# Patient Record
Sex: Male | Born: 1980 | Race: Black or African American | Hispanic: No | Marital: Single | Smoking: Former smoker
Health system: Southern US, Community
[De-identification: ages and names within clinical notes are randomized; demographics above are authoritative.]

## PROBLEM LIST (undated history)

## (undated) DIAGNOSIS — F410 Panic disorder [episodic paroxysmal anxiety] without agoraphobia: Secondary | ICD-10-CM

## (undated) DIAGNOSIS — R002 Palpitations: Secondary | ICD-10-CM

## (undated) DIAGNOSIS — F952 Tourette's disorder: Secondary | ICD-10-CM

## (undated) DIAGNOSIS — E785 Hyperlipidemia, unspecified: Secondary | ICD-10-CM

## (undated) DIAGNOSIS — E878 Other disorders of electrolyte and fluid balance, not elsewhere classified: Secondary | ICD-10-CM

## (undated) DIAGNOSIS — F4310 Post-traumatic stress disorder, unspecified: Secondary | ICD-10-CM

## (undated) DIAGNOSIS — K603 Anal fistula, unspecified: Secondary | ICD-10-CM

## (undated) DIAGNOSIS — H93A9 Pulsatile tinnitus, unspecified ear: Secondary | ICD-10-CM

## (undated) DIAGNOSIS — E669 Obesity, unspecified: Secondary | ICD-10-CM

## (undated) DIAGNOSIS — F419 Anxiety disorder, unspecified: Secondary | ICD-10-CM

## (undated) DIAGNOSIS — G4730 Sleep apnea, unspecified: Secondary | ICD-10-CM

## (undated) DIAGNOSIS — F319 Bipolar disorder, unspecified: Secondary | ICD-10-CM

## (undated) DIAGNOSIS — F329 Major depressive disorder, single episode, unspecified: Secondary | ICD-10-CM

## (undated) DIAGNOSIS — F32A Depression, unspecified: Secondary | ICD-10-CM

## (undated) DIAGNOSIS — F959 Tic disorder, unspecified: Secondary | ICD-10-CM

## (undated) DIAGNOSIS — R42 Dizziness and giddiness: Secondary | ICD-10-CM

## (undated) DIAGNOSIS — I10 Essential (primary) hypertension: Secondary | ICD-10-CM

---

## 2009-07-22 ENCOUNTER — Emergency Department (HOSPITAL_COMMUNITY): Admission: EM | Admit: 2009-07-22 | Discharge: 2009-07-22 | Payer: Self-pay | Admitting: Emergency Medicine

## 2010-11-26 ENCOUNTER — Emergency Department (HOSPITAL_COMMUNITY)
Admission: EM | Admit: 2010-11-26 | Discharge: 2010-11-26 | Attending: Emergency Medicine | Admitting: Emergency Medicine

## 2010-11-26 ENCOUNTER — Emergency Department (HOSPITAL_COMMUNITY)

## 2010-11-26 DIAGNOSIS — S59909A Unspecified injury of unspecified elbow, initial encounter: Secondary | ICD-10-CM | POA: Insufficient documentation

## 2010-11-26 DIAGNOSIS — IMO0001 Reserved for inherently not codable concepts without codable children: Secondary | ICD-10-CM | POA: Insufficient documentation

## 2010-11-26 DIAGNOSIS — M545 Low back pain, unspecified: Secondary | ICD-10-CM | POA: Insufficient documentation

## 2010-11-26 DIAGNOSIS — S6990XA Unspecified injury of unspecified wrist, hand and finger(s), initial encounter: Secondary | ICD-10-CM | POA: Insufficient documentation

## 2010-11-26 DIAGNOSIS — IMO0002 Reserved for concepts with insufficient information to code with codable children: Secondary | ICD-10-CM | POA: Insufficient documentation

## 2010-11-26 DIAGNOSIS — M79609 Pain in unspecified limb: Secondary | ICD-10-CM | POA: Insufficient documentation

## 2010-11-26 DIAGNOSIS — S59919A Unspecified injury of unspecified forearm, initial encounter: Secondary | ICD-10-CM | POA: Insufficient documentation

## 2010-11-26 LAB — CBC
MCH: 31 pg (ref 26.0–34.0)
MCHC: 35.1 g/dL (ref 30.0–36.0)
Platelets: 292 10*3/uL (ref 150–400)
RBC: 5.42 MIL/uL (ref 4.22–5.81)

## 2010-11-26 LAB — URINALYSIS, ROUTINE W REFLEX MICROSCOPIC
Bilirubin Urine: NEGATIVE
Glucose, UA: NEGATIVE mg/dL
Hgb urine dipstick: NEGATIVE
Ketones, ur: NEGATIVE mg/dL
Protein, ur: NEGATIVE mg/dL
pH: 5.5 (ref 5.0–8.0)

## 2010-11-26 LAB — POCT I-STAT, CHEM 8
BUN: 15 mg/dL (ref 6–23)
Calcium, Ion: 1.13 mmol/L (ref 1.12–1.32)
Chloride: 105 mEq/L (ref 96–112)
Creatinine, Ser: 1.3 mg/dL (ref 0.4–1.5)
Glucose, Bld: 126 mg/dL — ABNORMAL HIGH (ref 70–99)
TCO2: 22 mmol/L (ref 0–100)

## 2010-11-26 LAB — RAPID URINE DRUG SCREEN, HOSP PERFORMED
Amphetamines: NOT DETECTED
Barbiturates: NOT DETECTED
Benzodiazepines: NOT DETECTED
Tetrahydrocannabinol: POSITIVE — AB

## 2010-11-26 LAB — DIFFERENTIAL
Basophils Absolute: 0 10*3/uL (ref 0.0–0.1)
Eosinophils Absolute: 0 10*3/uL (ref 0.0–0.7)
Lymphocytes Relative: 13 % (ref 12–46)
Neutro Abs: 12.7 10*3/uL — ABNORMAL HIGH (ref 1.7–7.7)

## 2010-11-26 LAB — ETHANOL: Alcohol, Ethyl (B): <5 mg/dL (ref 0–10)

## 2010-11-27 LAB — GC/CHLAMYDIA PROBE AMP, GENITAL: GC Probe Amp, Genital: NEGATIVE

## 2016-03-03 ENCOUNTER — Emergency Department (HOSPITAL_COMMUNITY)
Admission: EM | Admit: 2016-03-03 | Discharge: 2016-03-03 | Disposition: A | Discharge status: Home / Self Care | Payer: Self-pay | Attending: Emergency Medicine | Admitting: Emergency Medicine

## 2016-03-03 ENCOUNTER — Encounter (HOSPITAL_COMMUNITY): Payer: Self-pay | Admitting: *Deleted

## 2016-03-03 DIAGNOSIS — K4090 Unilateral inguinal hernia, without obstruction or gangrene, not specified as recurrent: Secondary | ICD-10-CM | POA: Insufficient documentation

## 2016-03-03 DIAGNOSIS — I10 Essential (primary) hypertension: Secondary | ICD-10-CM | POA: Insufficient documentation

## 2016-03-03 HISTORY — DX: Other disorders of electrolyte and fluid balance, not elsewhere classified: E87.8

## 2016-03-03 HISTORY — DX: Depression, unspecified: F32.A

## 2016-03-03 HISTORY — DX: Major depressive disorder, single episode, unspecified: F32.9

## 2016-03-03 HISTORY — DX: Essential (primary) hypertension: I10

## 2016-03-03 LAB — URINALYSIS, ROUTINE W REFLEX MICROSCOPIC
Bilirubin Urine: NEGATIVE
Glucose, UA: NEGATIVE mg/dL
HGB URINE DIPSTICK: NEGATIVE
Ketones, ur: NEGATIVE mg/dL
LEUKOCYTES UA: NEGATIVE
NITRITE: NEGATIVE
PROTEIN: NEGATIVE mg/dL
Specific Gravity, Urine: 1.027 (ref 1.005–1.030)
pH: 5.5 (ref 5.0–8.0)

## 2016-03-03 NOTE — ED Notes (Signed)
Pt c/o left groin pain for three weeks, possible hernia. Pt states certain types of movement increases pain. Pt states he can feel a lump in her left groin. Pt denies dysuria, hematuria.

## 2016-03-03 NOTE — ED Provider Notes (Signed)
  History  By signing my name below, I, Samuel Austin, attest that this documentation has been prepared under the direction and in the presence of Arthor CaptainAbigail Robbie Nangle, PA-C. Electronically Signed: Earmon PhoenixJennifer Austin, ED Scribe. 03/03/2016. 11:16 PM.  Chief Complaint  Patient presents with  . Groin Pain   The history is provided by the patient and medical records. No language interpreter was used.    HPI Comments:  Samuel Austin is an obese 35 y.o. male who presents to the Emergency Department complaining of an unchanged, small lump to the left groin that began about three weeks ago. He states he believes he may have a hernia. He reports mild to moderate pain depending on what he is doing. He has not done anything to treat the pain. Moving, coughing or stretching increases the pain. Resting alleviates the pain. He denies fever, chills, nausea, vomiting, penile or scrotal pain or swelling.  Past Medical History  Diagnosis Date  . Hyperchloremia   . Hypertension   . Depression    No past surgical history on file. History reviewed. No pertinent family history. Social History  Substance Use Topics  . Smoking status: Never Smoker   . Smokeless tobacco: Never Used  . Alcohol Use: Yes    Review of Systems  Constitutional: Negative for fever and chills.  Gastrointestinal: Negative for nausea and vomiting.  Genitourinary:       Lump to left groin    Allergies  Motrin  Home Medications   Prior to Admission medications   Not on File   Triage Vitals: BP 123/82 mmHg  Pulse 64  Temp(Src) 98.7 F (37.1 C) (Oral)  Resp 16  Ht 5\' 4"  (1.626 m)  Wt 220 lb 4 oz (99.905 kg)  BMI 37.79 kg/m2  SpO2 98% Physical Exam  Constitutional: He is oriented to person, place, and time. He appears well-developed and well-nourished.  HENT:  Head: Normocephalic and atraumatic.  Eyes: EOM are normal.  Neck: Normal range of motion.  Cardiovascular: Normal rate.   Pulmonary/Chest: Effort normal.   Abdominal: A hernia is present. Hernia confirmed positive in the left inguinal area.  Musculoskeletal: Normal range of motion.  Neurological: He is alert and oriented to person, place, and time.  Skin: Skin is warm and dry.  Psychiatric: He has a normal mood and affect. His behavior is normal.  Nursing note and vitals reviewed.   ED Course  Procedures (including critical care time) DIAGNOSTIC STUDIES: Oxygen Saturation is 98% on RA, normal by my interpretation.   COORDINATION OF CARE: 11:02 PM- Will give referral to surgery center for follow up. Pt verbalizes understanding and agrees to plan.  Medications - No data to display  Labs Review Labs Reviewed  URINALYSIS, ROUTINE W REFLEX MICROSCOPIC (NOT AT [facility])    MDM   Final diagnoses:  Direct inguinal hernia    Patient with easily reducible direct inguinal hernia. No signs of incarceration. Nontender. More palpable with valsalva or cough. Discussed supportive care, elective surgery, reasons to seek immediate medical care.  Appears safe for discharge at this time.    I personally performed the services described in this documentation, which was scribed in my presence. The recorded information has been reviewed and is accurate.       Arthor Captainbigail Hendry Speas, PA-C 03/04/16 0129  Benjiman CoreNathan Pickering, MD 03/05/16 [phone]

## 2016-03-03 NOTE — Discharge Instructions (Signed)

## 2016-07-29 ENCOUNTER — Encounter (HOSPITAL_COMMUNITY): Payer: Self-pay | Admitting: Emergency Medicine

## 2016-07-29 ENCOUNTER — Emergency Department (HOSPITAL_COMMUNITY)
Admission: EM | Admit: 2016-07-29 | Discharge: 2016-07-29 | Disposition: A | Discharge status: Home / Self Care | Payer: Self-pay | Attending: Emergency Medicine | Admitting: Emergency Medicine

## 2016-07-29 DIAGNOSIS — R197 Diarrhea, unspecified: Secondary | ICD-10-CM | POA: Insufficient documentation

## 2016-07-29 DIAGNOSIS — I10 Essential (primary) hypertension: Secondary | ICD-10-CM | POA: Insufficient documentation

## 2016-07-29 DIAGNOSIS — Z7982 Long term (current) use of aspirin: Secondary | ICD-10-CM | POA: Insufficient documentation

## 2016-07-29 LAB — URINALYSIS, ROUTINE W REFLEX MICROSCOPIC
Bacteria, UA: NONE SEEN
Bilirubin Urine: NEGATIVE
Glucose, UA: NEGATIVE mg/dL
Hgb urine dipstick: NEGATIVE
KETONES UR: NEGATIVE mg/dL
Leukocytes, UA: NEGATIVE
Nitrite: NEGATIVE
PROTEIN: 100 mg/dL — AB
Specific Gravity, Urine: 1.027 (ref 1.005–1.030)
pH: 8 (ref 5.0–8.0)

## 2016-07-29 LAB — COMPREHENSIVE METABOLIC PANEL
ALBUMIN: 4.3 g/dL (ref 3.5–5.0)
ALT: 30 U/L (ref 17–63)
AST: 56 U/L — AB (ref 15–41)
Alkaline Phosphatase: 61 U/L (ref 38–126)
Anion gap: 7 (ref 5–15)
BUN: 13 mg/dL (ref 6–20)
CHLORIDE: 107 mmol/L (ref 101–111)
CO2: 26 mmol/L (ref 22–32)
Calcium: 9.3 mg/dL (ref 8.9–10.3)
Creatinine, Ser: 1.2 mg/dL (ref 0.61–1.24)
GFR calc Af Amer: >60 mL/min (ref >60)
GFR calc non Af Amer: >60 mL/min (ref >60)
GLUCOSE: 102 mg/dL — AB (ref 65–99)
POTASSIUM: 4.8 mmol/L (ref 3.5–5.1)
Sodium: 140 mmol/L (ref 135–145)
Total Bilirubin: 1.6 mg/dL — ABNORMAL HIGH (ref 0.3–1.2)
Total Protein: 7.2 g/dL (ref 6.5–8.1)

## 2016-07-29 LAB — CBC
HCT: 50.5 % (ref 39.0–52.0)
HEMOGLOBIN: 17.7 g/dL — AB (ref 13.0–17.0)
MCH: 31.3 pg (ref 26.0–34.0)
MCHC: 35 g/dL (ref 30.0–36.0)
MCV: 89.2 fL (ref 78.0–100.0)
Platelets: 238 10*3/uL (ref 150–400)
RBC: 5.66 MIL/uL (ref 4.22–5.81)
RDW: 12.4 % (ref 11.5–15.5)
WBC: 10.5 10*3/uL (ref 4.0–10.5)

## 2016-07-29 LAB — LIPASE, BLOOD: LIPASE: 96 U/L — AB (ref 11–51)

## 2016-07-29 MED ORDER — ONDANSETRON 4 MG PO TBDP
ORAL_TABLET | ORAL | Status: AC
  Filled 2016-07-29: qty 1

## 2016-07-29 MED ORDER — ONDANSETRON 4 MG PO TBDP
4.0000 mg | ORAL_TABLET | Freq: Once | ORAL | Status: AC | PRN
  Administered 2016-07-29: 4 mg via ORAL

## 2016-07-29 MED ORDER — ONDANSETRON 4 MG PO TBDP
4.0000 mg | ORAL_TABLET | Freq: Once | ORAL | Status: DC
  Filled 2016-07-29: qty 1

## 2016-07-29 MED ORDER — DIPHENOXYLATE-ATROPINE 2.5-0.025 MG PO TABS
2.0000 | ORAL_TABLET | Freq: Once | ORAL | Status: AC
  Administered 2016-07-29: 2 via ORAL
  Filled 2016-07-29: qty 2

## 2016-07-29 NOTE — ED Notes (Signed)
[place], [place] aware pt is requesting to see her.

## 2016-07-29 NOTE — ED Triage Notes (Signed)
Pt sts nausea and diarrhea starting this am with body aches

## 2016-07-29 NOTE — ED Notes (Signed)
TAKING PO FLUIDS AND TOLERATING WELL.

## 2016-07-29 NOTE — ED Provider Notes (Signed)
[facility]-EMERGENCY DEPT Provider Note   CSN: [id number] Arrival date & time: 07/29/16  1333     History   Chief Complaint Chief Complaint  Patient presents with  . Nausea  . Diarrhea  . Generalized Body Aches    HPI Samuel Austin is a 35 y.o. male.  The history is provided by the patient. No language interpreter was used.  Diarrhea   This is a new problem. The problem occurs 2 to 4 times per day. The problem has been gradually worsening. There has been no fever. Pertinent negatives include no abdominal pain. He has tried nothing for the symptoms. The treatment provided no relief. Risk factors include suspect food intake. His past medical history does not include recent abdominal surgery.  Pt reports indigestion and multiple episodes of  diarrhea.  Pt   Past Medical History:  Diagnosis Date  . Depression   . Hyperchloremia   . Hypertension     There are no active problems to display for this patient.   History reviewed. No pertinent surgical history.     Home Medications    Prior to Admission medications   Medication Sig Start Date End Date Taking? Authorizing Provider  aspirin EC 81 MG tablet Take 81 mg by mouth daily.   Yes Historical Provider, MD  hydrochlorothiazide (HYDRODIURIL) 25 MG tablet Take 25 mg by mouth daily.   Yes Historical Provider, MD  lisinopril (PRINIVIL,ZESTRIL) 20 MG tablet Take 20 mg by mouth daily.   Yes Historical Provider, MD    Family History History reviewed. No pertinent family history.  Social History Social History  Substance Use Topics  . Smoking status: Never Smoker  . Smokeless tobacco: Never Used  . Alcohol use Yes     Allergies   Motrin [ibuprofen]   Review of Systems Review of Systems  Gastrointestinal: Positive for diarrhea and nausea. Negative for abdominal pain.  All other systems reviewed and are negative.    Physical Exam Updated Vital Signs BP 148/94   Pulse 79   Temp 99.4 F (37.4 C) (Oral)    Resp 18   SpO2 99%   Physical Exam  Constitutional: He appears well-developed and well-nourished.  HENT:  Head: Normocephalic and atraumatic.  Eyes: Conjunctivae are normal.  Neck: Neck supple.  Cardiovascular: Normal rate, regular rhythm and normal heart sounds.   No murmur heard. Pulmonary/Chest: Effort normal and breath sounds normal. No respiratory distress.  Abdominal: Soft. He exhibits no distension. There is no tenderness. There is no guarding.  Musculoskeletal: He exhibits no edema.  Neurological: He is alert.  Skin: Skin is warm and dry.  Psychiatric: He has a normal mood and affect.  Nursing note and vitals reviewed.    ED Treatments / Results  Labs (all labs ordered are listed, but only abnormal results are displayed) Labs Reviewed  CBC - Abnormal; Notable for the following:       Result Value   Hemoglobin 17.7 (*)    All other components within normal limits  URINALYSIS, ROUTINE W REFLEX MICROSCOPIC - Abnormal; Notable for the following:    Protein, ur 100 (*)    Squamous Epithelial / LPF 0-5 (*)    All other components within normal limits  COMPREHENSIVE METABOLIC PANEL - Abnormal; Notable for the following:    Glucose, Bld 102 (*)    AST 56 (*)    Total Bilirubin 1.6 (*)    All other components within normal limits  LIPASE, BLOOD - Abnormal; Notable for the  following:    Lipase 96 (*)    All other components within normal limits    EKG  EKG Interpretation None       Radiology No results found.  Procedures Procedures (including critical care time)  Medications Ordered in ED Medications  ondansetron (ZOFRAN-ODT) disintegrating tablet 4 mg (4 mg Oral Not Given 07/29/16 1645)  ondansetron (ZOFRAN-ODT) disintegrating tablet 4 mg (4 mg Oral Given 07/29/16 1345)  diphenoxylate-atropine (LOMOTIL) 2.5-0.025 MG per tablet 2 tablet (2 tablets Oral Given 07/29/16 1655)     Initial Impression / Assessment and Plan / ED Course  I have reviewed the triage  vital signs and the nursing notes.  Pertinent labs & imaging results that were available during my care of the patient were reviewed by me and considered in my medical decision making (see chart for details).  Clinical Course     Pt tolerating po fluids.  No sign of acute abdomen.  Pt suspects food related illness, more likely viral. I doubt pancreatitis.   Pt counseled on need to return if symptoms worsen.   No diarrhea while here.  24 ounces po fluids.    Final Clinical Impressions(s) / ED Diagnoses   Final diagnoses:  Diarrhea, unspecified type    New Prescriptions Discharge Medication List as of 07/29/2016  7:33 PM    An After Visit Summary was printed and given to the patient.   Lonia SkinnerLeslie K SpearvilleSofia, PA-C 07/29/16 1956    Canary Brimhristopher J Tegeler, MD 07/30/16 [phone]

## 2016-07-29 NOTE — ED Notes (Signed)
Pt stats nausea improved from earlier meds. Will imform MD.

## 2016-07-29 NOTE — Discharge Instructions (Signed)
Return if any problems.

## 2016-07-29 NOTE — ED Notes (Signed)
Lab called CMP and Lipase hemolyzed - redraw

## 2016-07-29 NOTE — ED Notes (Signed)
Pt taking po fluids and requesting update.

## 2018-01-21 ENCOUNTER — Encounter (HOSPITAL_COMMUNITY): Payer: Self-pay

## 2018-01-21 ENCOUNTER — Emergency Department (HOSPITAL_COMMUNITY): Payer: Self-pay

## 2018-01-21 ENCOUNTER — Emergency Department (HOSPITAL_COMMUNITY)
Admission: EM | Admit: 2018-01-21 | Discharge: 2018-01-22 | Disposition: A | Discharge status: Home / Self Care | Payer: Self-pay | Attending: Physician Assistant | Admitting: Physician Assistant

## 2018-01-21 ENCOUNTER — Other Ambulatory Visit: Payer: Self-pay

## 2018-01-21 DIAGNOSIS — R51 Headache: Secondary | ICD-10-CM | POA: Insufficient documentation

## 2018-01-21 DIAGNOSIS — I10 Essential (primary) hypertension: Secondary | ICD-10-CM | POA: Insufficient documentation

## 2018-01-21 DIAGNOSIS — M79602 Pain in left arm: Secondary | ICD-10-CM | POA: Insufficient documentation

## 2018-01-21 DIAGNOSIS — R0602 Shortness of breath: Secondary | ICD-10-CM | POA: Insufficient documentation

## 2018-01-21 DIAGNOSIS — F1210 Cannabis abuse, uncomplicated: Secondary | ICD-10-CM | POA: Insufficient documentation

## 2018-01-21 DIAGNOSIS — Z7982 Long term (current) use of aspirin: Secondary | ICD-10-CM | POA: Insufficient documentation

## 2018-01-21 DIAGNOSIS — R42 Dizziness and giddiness: Secondary | ICD-10-CM | POA: Insufficient documentation

## 2018-01-21 DIAGNOSIS — Z79899 Other long term (current) drug therapy: Secondary | ICD-10-CM | POA: Insufficient documentation

## 2018-01-21 DIAGNOSIS — R202 Paresthesia of skin: Secondary | ICD-10-CM | POA: Insufficient documentation

## 2018-01-21 LAB — URINALYSIS, ROUTINE W REFLEX MICROSCOPIC
BACTERIA UA: NONE SEEN
Bilirubin Urine: NEGATIVE
GLUCOSE, UA: NEGATIVE mg/dL
Ketones, ur: NEGATIVE mg/dL
Leukocytes, UA: NEGATIVE
Nitrite: NEGATIVE
Protein, ur: NEGATIVE mg/dL
Specific Gravity, Urine: 1.02 (ref 1.005–1.030)
pH: 5 (ref 5.0–8.0)

## 2018-01-21 LAB — BASIC METABOLIC PANEL
Anion gap: 14 (ref 5–15)
BUN: 15 mg/dL (ref 6–20)
CHLORIDE: 104 mmol/L (ref 101–111)
CO2: 23 mmol/L (ref 22–32)
CREATININE: 1.19 mg/dL (ref 0.61–1.24)
Calcium: 9.8 mg/dL (ref 8.9–10.3)
GFR calc Af Amer: >60 mL/min (ref >60)
GFR calc non Af Amer: >60 mL/min (ref >60)
Glucose, Bld: 96 mg/dL (ref 65–99)
POTASSIUM: 4.4 mmol/L (ref 3.5–5.1)
Sodium: 141 mmol/L (ref 135–145)

## 2018-01-21 LAB — CBC
HEMATOCRIT: 52.2 % — AB (ref 39.0–52.0)
Hemoglobin: 17.3 g/dL — ABNORMAL HIGH (ref 13.0–17.0)
MCH: 29.2 pg (ref 26.0–34.0)
MCHC: 33.1 g/dL (ref 30.0–36.0)
MCV: 88.2 fL (ref 78.0–100.0)
PLATELETS: 337 10*3/uL (ref 150–400)
RBC: 5.92 MIL/uL — AB (ref 4.22–5.81)
RDW: 12.2 % (ref 11.5–15.5)
WBC: 6.7 10*3/uL (ref 4.0–10.5)

## 2018-01-21 LAB — I-STAT TROPONIN, ED: TROPONIN I, POC: 0 ng/mL (ref 0.00–0.08)

## 2018-01-21 LAB — CBG MONITORING, ED: Glucose-Capillary: 93 mg/dL (ref 65–99)

## 2018-01-21 MED ORDER — SODIUM CHLORIDE 0.9 % IV BOLUS
1000.0000 mL | Freq: Once | INTRAVENOUS | Status: AC
  Administered 2018-01-21: 1000 mL via INTRAVENOUS

## 2018-01-21 MED ORDER — SODIUM CHLORIDE 0.9 % IV BOLUS: 1000.0000 mL | Freq: Once | INTRAVENOUS | Status: DC

## 2018-01-21 NOTE — ED Triage Notes (Signed)
Pt states he has been having tingling in his left arm X2 days. He reports this morning he began having some dizziness, shortness of breath, and headache this morning. Pt takes HTN medications. Pt alert and oriented at this time.

## 2018-01-21 NOTE — ED Notes (Signed)
Pt given urine sample at this time.  

## 2018-01-21 NOTE — ED Notes (Signed)
ED Provider at bedside. 

## 2018-01-21 NOTE — ED Provider Notes (Signed)
Patient placed in Quick Look pathway, seen and evaluated   Chief Complaint: Arm tingling, shortness of breath, headache  HPI:   The past several days, patient has had left arm tingling.  It is intermittent, described as a heat and tingling which resolved without intervention.  This morning, he had an episode of shortness of breath.  This has resolved.  No chest pain.  He has a mild headache.  He called his aunt who told him it could be lots of scary things and he should come to the hospital.  He has a history of hypertension for which he takes medication (took it this am).  No history of diabetes.  No weakness, is not dropping things with his arm.  No fall or injury.  He did smoke marijuana this morning.  He states there might have been feeling anxious this morning when he was feeling short of breath.  ROS: Headache, right arm tingling, SOB  Physical Exam:   Gen: No distress  Neuro: Awake and Alert.  Grip strength intact bilaterally.  Upper arm strength equal bilaterally.  Radial pulses intact bilaterally.  Sensation intact bilaterally.  PERRLA and EOMI.  No obvious neurologic deficits.             CV/Pulm. RRR. CTAB  Skin: Warm  Will order basic labs for further evaluation.   Initiation of care has begun. The patient has been counseled on the process, plan, and necessity for staying for the completion/evaluation, and the remainder of the medical screening examination    Alveria Apley, PA-C 01/21/18 1340    Arby Barrette, MD 01/28/18 1357

## 2018-01-21 NOTE — ED Notes (Signed)
Patient transported to CT 

## 2018-01-21 NOTE — ED Notes (Signed)
Patient transported to X-ray 

## 2018-01-21 NOTE — Discharge Instructions (Signed)
Your work-up has been reassuring.  It is very important that you take your blood pressure medicine as prescribed daily.  Recommend diet and exercise.  Drink plenty of fluids stay hydrated.  Make she follow-up with a primary care doctor to have a thorough work-up.  Return to ED if you develop worsening chest pain, shortness of breath, headaches, vision changes or numbness and tingling in your arms and legs.

## 2018-01-22 LAB — I-STAT TROPONIN, ED: Troponin i, poc: 0 ng/mL (ref 0.00–0.08)

## 2018-01-22 NOTE — ED Provider Notes (Signed)
[facility] EMERGENCY DEPARTMENT Provider Note   CSN: [id number] Arrival date & time: 01/21/18  1313     History   Chief Complaint Chief Complaint  Patient presents with  . Shortness of Breath  . Tingling    HPI Samuel Austin is a 37 y.o. male.  HPI 37 year old African-American male past medical history significant for hypertension presents to the emergency department today for evaluation of shortness of breath, left arm "tingling" and tightness, and intermittent headaches and dizziness.  Patient states that the symptoms have been ongoing for the past several months.  States for the past 2 weeks they have become more frequent.  States that he woke up this morning feeling okay.  Started to eat applesauce and started to get "hot feeling in his head".  Patient states that he felt like his left arm was tingling and he had to shake it out.  He states that his left arm started to get tight.  He became short of breath and was sweating.  States he had to stand from the [facility] unit.  Patient denies any chest pain with these episodes.  He states his episodes have no aggravating or relieving factors.  They usually resolve after he takes his blood pressure medicine.  Patient reports history of hypertension but states that he does not take his blood pressure medicine daily.  He takes it only when he thinks he needs it.  Patient reports marijuana history but no other drug use.  Patient states that during the episode this morning he was feeling very anxious and possibly having a panic attack.  Patient states that all his symptoms have completely resolved at this time.  Patient states that he will have a mild headache with his episodes but denies any vision changes.  Nothing makes symptoms worse.  He has taken no other medications for his symptoms prior to arrival.  She denies any cardiac history.  Denies any significant family cardiac history.  Patient denies any history of diabetes.   Denies any tobacco use except for marijuana.  Denies any history of PE/DVT, prolonged immobilization, recent hospitalization/surgeries, unilateral leg swelling or calf tenderness, hemoptysis.  Pt denies any fever, chill, ha, vision changes, lightheadedness,  congestion, neck pain, cough, abd pain, n/v/d, urinary symptoms, change in bowel habits, melena, hematochezia, lower extremity paresthesias.  Past Medical History:  Diagnosis Date  . Depression   . Hyperchloremia   . Hypertension     There are no active problems to display for this patient.   History reviewed. No pertinent surgical history.      Home Medications    Prior to Admission medications   Medication Sig Start Date End Date Taking? Authorizing Provider  aspirin EC 81 MG tablet Take 81 mg by mouth daily.   Yes [provider]  hydrochlorothiazide (HYDRODIURIL) 25 MG tablet Take 25 mg by mouth daily.   Yes [provider]  lisinopril (PRINIVIL,ZESTRIL) 20 MG tablet Take 20 mg by mouth daily.   Yes [provider]    Family History History reviewed. No pertinent family history.  Social History Social History   Tobacco Use  . Smoking status: Never Smoker  . Smokeless tobacco: Never Used  Substance Use Topics  . Alcohol use: Yes  . Drug use: Yes    Types: Marijuana     Allergies   Motrin [ibuprofen]   Review of Systems Review of Systems  All other systems reviewed and are negative.    Physical Exam  Updated Vital Signs BP (!) 157/88   Pulse 64   Temp 98.2 F (36.8 C) (Oral)   Resp 19   SpO2 100%   Physical Exam  Constitutional: He is oriented to person, place, and time. He appears well-developed and well-nourished.  Non-toxic appearance. No distress.  HENT:  Head: Normocephalic and atraumatic.  Nose: Nose normal.  Mouth/Throat: Oropharynx is clear and moist.  Eyes: Pupils are equal, round, and reactive to light. Conjunctivae are normal. Right eye exhibits no  discharge. Left eye exhibits no discharge.  Neck: Normal range of motion. Neck supple. No JVD present. No tracheal deviation present.  Cardiovascular: Normal rate, regular rhythm, normal heart sounds and intact distal pulses.  Pulmonary/Chest: Effort normal and breath sounds normal. No respiratory distress. He exhibits no tenderness.  No hypoxia or tachypnea.  Abdominal: Soft. Bowel sounds are normal. He exhibits no distension. There is no tenderness. There is no rebound and no guarding.  Musculoskeletal: Normal range of motion.       Right lower leg: Normal. He exhibits no edema.       Left lower leg: Normal.  No lower extremity edema or calf tenderness.  Lymphadenopathy:    He has no cervical adenopathy.  Neurological: He is alert and oriented to person, place, and time.  The patient is alert, attentive, and oriented x 3. Speech is clear. Cranial nerve II-VII grossly intact. Negative pronator drift. Sensation intact. Strength 5/5 in all extremities. Reflexes 2+ and symmetric at biceps, triceps, knees, and ankles. Rapid alternating movement and fine finger movements intact. Romberg is absent. Posture and gait normal.   Skin: Skin is warm and dry. Capillary refill takes less than 2 seconds. He is not diaphoretic.  Psychiatric: His behavior is normal. Judgment and thought content normal.  Nursing note and vitals reviewed.    ED Treatments / Results  Labs (all labs ordered are listed, but only abnormal results are displayed) Labs Reviewed  CBC - Abnormal; Notable for the following components:      Result Value   RBC 5.92 (*)    Hemoglobin 17.3 (*)    HCT 52.2 (*)    All other components within normal limits  URINALYSIS, ROUTINE W REFLEX MICROSCOPIC - Abnormal; Notable for the following components:   Hgb urine dipstick SMALL (*)    All other components within normal limits  BASIC METABOLIC PANEL  CBG MONITORING, ED  I-STAT TROPONIN, ED  I-STAT TROPONIN, ED    EKG EKG  Interpretation  Date/Time:  Thursday Jan 21 2018 13:19:26 EDT Ventricular Rate:  91 PR Interval:  160 QRS Duration: 100 QT Interval:  366 QTC Calculation: 450 R Axis:   68 Text Interpretation:  Normal sinus rhythm Nonspecific T wave abnormality Abnormal ECG Normal sinus rhythm Confirmed by Corlis LeakMackuen, Courteney ([id number]) on 01/21/2018 8:11:08 PM   Radiology Dg Chest 2 View  Result Date: 01/21/2018 CLINICAL DATA:  Initial evaluation for acute shortness of breath. EXAM: CHEST - 2 VIEW COMPARISON:  None. FINDINGS: Mild cardiomegaly.  Mediastinal silhouette normal. Lungs normally inflated. No focal infiltrates. Mild pulmonary interstitial congestion without frank pulmonary edema. No pleural effusion. No pneumothorax. No acute osseous abnormality. IMPRESSION: Cardiomegaly with mild pulmonary interstitial congestion without frank pulmonary edema. Electronically Signed   By: Rise MuBenjamin  McClintock M.D.   On: 01/21/2018 21:15   Ct Head Wo Contrast  Result Date: 01/21/2018 CLINICAL DATA:  Dizziness/vertigo EXAM: CT HEAD WITHOUT CONTRAST TECHNIQUE: Contiguous axial images were obtained from the base of the skull through the  vertex without intravenous contrast. COMPARISON:  None. FINDINGS: Brain: No evidence of acute infarction, hemorrhage, hydrocephalus, extra-axial collection or mass lesion/mass effect. Vascular: No hyperdense vessel or unexpected calcification. Skull: Normal. Negative for fracture or focal lesion. Sinuses/Orbits: The visualized paranasal sinuses are essentially clear. The mastoid air cells are unopacified. Other: None. IMPRESSION: Normal head CT. Electronically Signed   By: Charline Bills M.D.   On: 01/21/2018 22:06    Procedures Procedures (including critical care time)  Medications Ordered in ED Medications  sodium chloride 0.9 % bolus 1,000 mL (0 mLs Intravenous Stopped 01/21/18 2344)     Initial Impression / Assessment and Plan / ED Course  I have reviewed the triage vital  signs and the nursing notes.  Pertinent labs & imaging results that were available during my care of the patient were reviewed by me and considered in my medical decision making (see chart for details).     Patient presents to the ED for evaluation of intermittent lightheadedness, dizziness, headaches, left arm pain and shortness of breath that he relates to his blood pressure being elevated.  Patient does not take his blood pressure medicine as prescribed.  The symptoms have been ongoing for the past 3 to 4 months.  Acutely worse over the past 2 weeks and becoming more frequent.  Had episode this morning.  All symptoms have completely resolved.  Denies any chest pain with these episodes. PERC negative.   She is very well-appearing.  Vital signs reassuring.  Patient is mildly hypertensive but no tachycardia or hypoxia noted.  Regular rate and rhythm.  Lungs clear to auscultation bilaterally.  Neurovascularly intact in all extremities.  No focal neuro deficit on exam.  No focal abdominal tenderness noted.  Orthostatics were negative.  Labs are very reassuring.  Negative delta troponin.  No leukocytosis.  Mild hemoconcentration with a hemoglobin of 17.3.  No significant electrolyte derangement.  UA shows no signs of infection but small amount of hemoglobin.  Normal kidney function.  EKG shows normal sinus rhythm.  Chest x-ray shows no acute findings.  I did perform CTs head at patient's request that showed no acute findings.  Again patient has no focal neuro deficit on exam.  I doubt symptoms are related to CVA or ICH.  Clinical presentation does not seem consistent with ACS, dissection, PE.   I encouraged patient to take his blood pressure medicine as prescribed and performed diet modifications with exercise.  Patient given resources for primary care and outpatient follow-up.  Patient continues to deny any symptoms.  He was given fluids in the ED.  Vital signs remained reassuring.  He is ready for  discharge at this time.  Pt is hemodynamically stable, in NAD, & able to ambulate in the ED. Evaluation does not show pathology that would require ongoing emergent intervention or inpatient treatment. I explained the diagnosis to the patient. Pain has been managed & has no complaints prior to dc. Pt is comfortable with above plan and is stable for discharge at this time. All questions were answered prior to disposition. Strict return precautions for f/u to the ED were discussed. Encouraged follow up with PCP.   Final Clinical Impressions(s) / ED Diagnoses   Final diagnoses:  SOB (shortness of breath)  Dizziness  Left arm pain    ED Discharge Orders    None       Wallace Keller 01/22/18 0117    Abelino Derrick, MD 01/23/18 [phone]

## 2018-02-23 ENCOUNTER — Other Ambulatory Visit: Payer: Self-pay

## 2018-02-23 ENCOUNTER — Encounter (HOSPITAL_COMMUNITY): Payer: Self-pay

## 2018-02-23 ENCOUNTER — Emergency Department (HOSPITAL_COMMUNITY)
Admission: EM | Admit: 2018-02-23 | Discharge: 2018-02-23 | Disposition: A | Discharge status: Home / Self Care | Payer: Self-pay | Attending: Emergency Medicine | Admitting: Emergency Medicine

## 2018-02-23 DIAGNOSIS — R519 Headache, unspecified: Secondary | ICD-10-CM

## 2018-02-23 DIAGNOSIS — I10 Essential (primary) hypertension: Secondary | ICD-10-CM | POA: Insufficient documentation

## 2018-02-23 DIAGNOSIS — Z79899 Other long term (current) drug therapy: Secondary | ICD-10-CM | POA: Insufficient documentation

## 2018-02-23 DIAGNOSIS — R51 Headache: Secondary | ICD-10-CM

## 2018-02-23 DIAGNOSIS — Z7982 Long term (current) use of aspirin: Secondary | ICD-10-CM | POA: Insufficient documentation

## 2018-02-23 DIAGNOSIS — R030 Elevated blood-pressure reading, without diagnosis of hypertension: Secondary | ICD-10-CM

## 2018-02-23 DIAGNOSIS — F329 Major depressive disorder, single episode, unspecified: Secondary | ICD-10-CM | POA: Insufficient documentation

## 2018-02-23 LAB — BASIC METABOLIC PANEL
Anion gap: 9 (ref 5–15)
BUN: 14 mg/dL (ref 6–20)
CALCIUM: 9.3 mg/dL (ref 8.9–10.3)
CO2: 24 mmol/L (ref 22–32)
Chloride: 105 mmol/L (ref 98–111)
Creatinine, Ser: 1.18 mg/dL (ref 0.61–1.24)
GFR calc Af Amer: >60 mL/min (ref >60)
Glucose, Bld: 100 mg/dL — ABNORMAL HIGH (ref 70–99)
POTASSIUM: 4 mmol/L (ref 3.5–5.1)
SODIUM: 138 mmol/L (ref 135–145)

## 2018-02-23 LAB — CBC
HCT: 49.5 % (ref 39.0–52.0)
HEMOGLOBIN: 16.1 g/dL (ref 13.0–17.0)
MCH: 29.9 pg (ref 26.0–34.0)
MCHC: 32.5 g/dL (ref 30.0–36.0)
MCV: 91.8 fL (ref 78.0–100.0)
Platelets: 310 10*3/uL (ref 150–400)
RBC: 5.39 MIL/uL (ref 4.22–5.81)
RDW: 12.2 % (ref 11.5–15.5)
WBC: 7 10*3/uL (ref 4.0–10.5)

## 2018-02-23 LAB — URINALYSIS, ROUTINE W REFLEX MICROSCOPIC
Bilirubin Urine: NEGATIVE
Glucose, UA: NEGATIVE mg/dL
Hgb urine dipstick: NEGATIVE
KETONES UR: NEGATIVE mg/dL
LEUKOCYTES UA: NEGATIVE
NITRITE: NEGATIVE
Protein, ur: NEGATIVE mg/dL
Specific Gravity, Urine: 1.014 (ref 1.005–1.030)
pH: 5 (ref 5.0–8.0)

## 2018-02-23 LAB — CBG MONITORING, ED: GLUCOSE-CAPILLARY: 101 mg/dL — AB (ref 70–99)

## 2018-02-23 NOTE — ED Provider Notes (Signed)
[facility] EMERGENCY DEPARTMENT Provider [place]   CSN: [id number] Arrival date & time: 02/23/18  8295     History   Chief Complaint Chief Complaint  Patient presents with  . Headache    HPI Samuel Parlato Gruenberg is a 37 y.o. male who presents the emergency department with chief complaint of headache and high blood pressure.  Patient states that he woke this morning with a throbbing headache and felt like he was having hot flashes.  Patient states that he took his blood pressure pressure medication and ate some food in his head felt better.  Later his headache returned so he went to the [facility] and measure his blood pressure and saw that it was 180/110.  He states that this was very concerning to him so he went home and took his afternoon dose of lisinopril.  His headache totally resolved after that.  He did not take anything for pain.  He denies changes in vision, unilateral weakness, difficulty with speech or swallowing, neck stiffness, thunderclap onset, fevers or chills.  HPI  Past Medical History:  Diagnosis Date  . Depression   . Hyperchloremia   . Hypertension     There are no active problems to display for this patient.   History reviewed. No pertinent surgical history.      Home Medications    Prior to Admission medications   Medication Sig Start Date End Date Taking? Authorizing Provider  aspirin EC 81 MG tablet Take 81 mg by mouth daily.    [provider]  hydrochlorothiazide (HYDRODIURIL) 25 MG tablet Take 25 mg by mouth daily.    [provider]  lisinopril (PRINIVIL,ZESTRIL) 20 MG tablet Take 20 mg by mouth daily.    [provider]    Family History History reviewed. No pertinent family history.  Social History Social History   Tobacco Use  . Smoking status: Never Smoker  . Smokeless tobacco: Never Used  Substance Use Topics  . Alcohol use: Yes  . Drug use: Yes    Types: Marijuana     Allergies     Motrin [ibuprofen]   Review of Systems Review of Systems Ten systems reviewed and are negative for acute change, except as noted in the HPI.   Physical Exam Updated Vital Signs BP (!) 146/87 (BP Location: Right Arm)   Pulse 76   Temp 98.8 F (37.1 C) (Oral)   Resp 18   Ht 5\' 4"  (1.626 m)   Wt 112 kg (247 lb)   SpO2 100%   BMI 42.40 kg/m   Physical Exam  Constitutional: He appears well-developed and well-nourished. No distress.  HENT:  Head: Normocephalic and atraumatic.  Eyes: Pupils are equal, round, and reactive to light. Conjunctivae and EOM are normal. No scleral icterus.  Neck: Normal range of motion. Neck supple.  Cardiovascular: Normal rate, regular rhythm and normal heart sounds.  Pulmonary/Chest: Effort normal and breath sounds normal. No respiratory distress.  Abdominal: Soft. There is no tenderness.  Musculoskeletal: He exhibits no edema.  Neurological: He is alert. He displays normal reflexes.  Speech is clear and goal oriented, follows commands Major Cranial nerves without deficit, no facial droop Normal strength in upper and lower extremities bilaterally including dorsiflexion and plantar flexion, strong and equal grip strength Sensation normal to light and sharp touch Moves extremities without ataxia, coordination intact Normal finger to nose and rapid alternating movements Neg romberg, no pronator drift Normal gait Normal heel-shin and balance   Skin: Skin  is warm and dry. He is not diaphoretic.  Psychiatric: His behavior is normal.  Nursing Austin and vitals reviewed.    ED Treatments / Results  Labs (all labs ordered are listed, but only abnormal results are displayed) Labs Reviewed  BASIC METABOLIC PANEL - Abnormal; Notable for the following components:      Result Value   Glucose, Bld 100 (*)    All other components within normal limits  URINALYSIS, ROUTINE W REFLEX MICROSCOPIC - Abnormal; Notable for the following components:   Color, Urine  STRAW (*)    All other components within normal limits  CBG MONITORING, ED - Abnormal; Notable for the following components:   Glucose-Capillary 101 (*)    All other components within normal limits  CBC    EKG EKG Interpretation  Date/Time:  Tuesday February 23 2018 19:19:03 EDT Ventricular Rate:  76 PR Interval:  176 QRS Duration: 96 QT Interval:  372 QTC Calculation: 418 R Axis:   59 Text Interpretation:  Normal sinus rhythm Normal ECG Confirmed by Margarita Grizzleay, Danielle [phone]([id number]) on 02/23/2018 7:48:40 PM   Radiology No results found.  Procedures Procedures (including critical care time)  Medications Ordered in ED Medications - No data to display   Initial Impression / Assessment and Plan / ED Course  I have reviewed the triage vital signs and the nursing notes.  Pertinent labs & imaging results that were available during my care of the patient were reviewed by me and considered in my medical decision making (see chart for details).     Patient with asymptomatic hypertension.  Blood pressure is improved here in the emergency department.  He is headache free. Samuel Austin HA treated and improved while in ED.  Presentation not concerning for [facility], ICH, Meningitis, or temporal arteritis. Samuel Austin is afebrile with no focal neuro deficits, nuchal rigidity, or change in vision. Samuel Austin is to follow up with PCP to discuss prophylactic medication. Samuel Austin verbalizes understanding and is agreeable with plan to dc.    Final Clinical Impressions(s) / ED Diagnoses   Final diagnoses:  Elevated blood pressure reading  Moderate headache    ED Discharge Orders    None       Arthor CaptainHarris, Akil Hoos, PA-C 02/24/18 0031    Margarita Grizzleay, Danielle, MD 02/24/18 [phone]

## 2018-02-23 NOTE — Discharge Instructions (Signed)

## 2018-02-23 NOTE — ED Notes (Signed)
Patient verbalizes understanding of discharge instructions. Opportunity for questioning and answers were provided. Armband removed by staff, pt discharged from ED ambulatory.   

## 2018-02-23 NOTE — ED Provider Notes (Signed)
Patient placed in Quick Look pathway, seen and evaluated   Chief Complaint: headache  HPI:   Donnetta SimpersLance Alexander Velasques is a 37 y.o. male who presents to the ED with headache that started this morning when he got up. At that time he rated the headache as 8/10. Since then is has decreased to 2/10. He took his BP and it was 177/103. Patient reports he also felt dizzy.patient ran out of one of his BP medications 2 days ago. Patient denies change in vision or nose bleed.   ROS: Neuro: headache, dizziness  Physical Exam:  BP (!) 146/87 (BP Location: Right Arm)   Pulse 76   Temp 98.8 F (37.1 C) (Oral)   Resp 18   Ht 5\' 4"  (1.626 m)   Wt 112 kg (247 lb)   SpO2 100%   BMI 42.40 kg/m    Gen: No distress  Neuro: Awake and Alert, ambulatory with steady gait  Skin: Warm and dry     Initiation of care has begun. The patient has been counseled on the process, plan, and necessity for staying for the completion/evaluation, and the remainder of the medical screening examination     Janne Napoleoneese, Adarrius Graeff M, NP 02/23/18 Remigio [id number]    Lockwood, Robert, MD 02/24/18 [phone]

## 2018-02-23 NOTE — ED Triage Notes (Signed)
Pt presents with onset of frontal headache upon awakening this morning.  Pt reports he ran out of one of his HTN meds x 2 days ago; reports dizziness and sweats.  Denies blurred vision or nosebleeds.

## 2018-03-11 ENCOUNTER — Encounter: Payer: Self-pay | Admitting: Family Medicine

## 2018-03-11 ENCOUNTER — Ambulatory Visit: Payer: Self-pay | Admitting: Family Medicine

## 2018-03-11 DIAGNOSIS — E785 Hyperlipidemia, unspecified: Secondary | ICD-10-CM | POA: Insufficient documentation

## 2018-03-11 DIAGNOSIS — I10 Essential (primary) hypertension: Secondary | ICD-10-CM

## 2018-03-11 DIAGNOSIS — F39 Unspecified mood [affective] disorder: Secondary | ICD-10-CM | POA: Insufficient documentation

## 2018-03-11 MED ORDER — LISINOPRIL 20 MG PO TABS: 20.0000 mg | ORAL_TABLET | Freq: Every day | ORAL | 1 refills | Status: DC

## 2018-03-11 MED ORDER — HYDROCHLOROTHIAZIDE 25 MG PO TABS: 25.0000 mg | ORAL_TABLET | Freq: Every day | ORAL | 1 refills | Status: DC

## 2018-03-11 NOTE — Assessment & Plan Note (Signed)
Not well controlled.  Discussed regular medication, sleep and diet.   He reports snoring so may need investigation for sleep apnea if hard to control.  Recent labs and head CT in ER.

## 2018-03-11 NOTE — Assessment & Plan Note (Signed)
By history.  Come in for fasting next visit so can look at triglycerides

## 2018-03-11 NOTE — Patient Instructions (Addendum)
Good to see you today!  Thanks for coming in.  Take both blood pressure medications same time every day  Drink plenty of fluids  Measure your blood pressure daily and bring in the cuff when you come back  Come in next visit without eating for 6 hours  Consider how to stop smoking - substitute for them   Come back 1-2 weeks

## 2018-03-11 NOTE — Assessment & Plan Note (Signed)
Did not explore fully.  Does not appear depressed or anxious today

## 2018-03-11 NOTE — Progress Notes (Signed)
Subjective  Samuel Austin is a 37 y.o. male is presenting with the following  HYPERTENSION Disease Monitoring  Home BP Monitoring (Severity) was 160/70 with his wrist cuff yesterday  Symptoms - Chest pain- no    Dyspnea- n0 Medications (Modifying factors) Compliance-  Taking lisinopril daily to several times a day depending on his blood pressure and how he feels.  Has been out of hctz for weeks. Lightheadedness-  Feel dizzy sometimes when he moves and has had a chronic top of the head ache for months. No focal neuro symptoms  Edema- no Timing - continuous  Duration - 6-7 years ROS - See HPI  DEPRESSION - [facility] and treated with unknown medication which made him sleepy and he does not take now  TRIGLYCERIDES Told in prison he had very high levels and was treated with lovastatin he believes   PMH Lab Review   Potassium  Date Value Ref Range Status  02/23/2018 4.0 3.5 - 5.1 mmol/L Final    Comment:    HEMOLYSIS AT THIS LEVEL MAY AFFECT RESULT   Sodium  Date Value Ref Range Status  02/23/2018 138 135 - 145 mmol/L Final   Creatinine, Ser  Date Value Ref Range Status  02/23/2018 1.18 0.61 - 1.24 mg/dL Final       SH Recently released from prison and his diet and exercise has worsened and he has gained wt  FHX - thinks his mother may have high blood pressure    Chief Complaint noted Review of Symptoms - see HPI PMH - Smoking status noted.    Objective Vital Signs reviewed BP (!) 180/90 (BP Location: Left Arm, Patient Position: Sitting, Cuff Size: Large)   Pulse 88   Resp 14   Ht 5' 2.5" (1.588 m)   Wt 240 lb (108.9 kg)   BMI 43.20 kg/m  Heart - Regular rate and rhythm.  No murmurs, gallops or rubs.    Lungs:  Normal respiratory effort, chest expands symmetrically. Lungs are clear to auscultation, no crackles or wheezes. Abdomen: soft and non-tender without masses, organomegaly or hernias noted.  No guarding or rebound Extremities:   No cyanosis, edema, or deformity noted with good range of motion of all major joints.   Neck:  No deformities, thyromegaly, masses, or tenderness noted.   Supple with full range of motion without pain.  Assessments/Plans  See after visit summary for details of patient instuctions  Hypertension Not well controlled.  Discussed regular medication, sleep and diet.   He reports snoring so may need investigation for sleep apnea if hard to control.  Recent labs and head CT in ER.    Hyperlipidemia By history.  Come in for fasting next visit so can look at triglycerides   Episodic mood disorder [facility](HCC) Did not explore fully.  Does not appear depressed or anxious today

## 2018-03-12 ENCOUNTER — Telehealth: Payer: Self-pay | Admitting: Licensed Clinical Social Worker

## 2018-03-12 NOTE — Telephone Encounter (Signed)
LCSW called patient in order to introduce self and provide information about social work services available at the clinic. Patient reported that he did not have any needs at this time but would save LCSW's number.

## 2018-03-30 ENCOUNTER — Ambulatory Visit: Payer: Self-pay | Admitting: Internal Medicine

## 2018-03-30 ENCOUNTER — Ambulatory Visit: Payer: Self-pay

## 2018-03-30 VITALS — BP 122/78 | HR 74

## 2018-03-30 DIAGNOSIS — I10 Essential (primary) hypertension: Secondary | ICD-10-CM

## 2018-03-30 NOTE — Progress Notes (Signed)
Patient BP in normal range. Informed to continue current dose of medication. Patient verbalized understanding. 

## 2018-04-01 ENCOUNTER — Ambulatory Visit: Payer: Self-pay | Admitting: Internal Medicine

## 2018-04-01 ENCOUNTER — Other Ambulatory Visit: Payer: Self-pay

## 2018-04-01 ENCOUNTER — Encounter: Payer: Self-pay | Admitting: Internal Medicine

## 2018-04-01 VITALS — BP 114/78 | HR 60 | Resp 12 | Ht 62.5 in | Wt 229.0 lb

## 2018-04-01 DIAGNOSIS — I10 Essential (primary) hypertension: Secondary | ICD-10-CM

## 2018-04-01 DIAGNOSIS — R42 Dizziness and giddiness: Secondary | ICD-10-CM

## 2018-04-01 DIAGNOSIS — R0683 Snoring: Secondary | ICD-10-CM

## 2018-04-01 DIAGNOSIS — J329 Chronic sinusitis, unspecified: Secondary | ICD-10-CM

## 2018-04-01 DIAGNOSIS — E785 Hyperlipidemia, unspecified: Secondary | ICD-10-CM

## 2018-04-01 DIAGNOSIS — H6123 Impacted cerumen, bilateral: Secondary | ICD-10-CM

## 2018-04-01 MED ORDER — AMOXICILLIN-POT CLAVULANATE 875-125 MG PO TABS: ORAL_TABLET | ORAL | 0 refills | Status: DC

## 2018-04-01 MED ORDER — FLUTICASONE PROPIONATE 50 MCG/ACT NA SUSP: 2.0000 | Freq: Every day | NASAL | 11 refills | Status: DC

## 2018-04-01 NOTE — Patient Instructions (Signed)

## 2018-04-01 NOTE — Progress Notes (Signed)
Subjective:    Patient ID: Samuel Austin, male    DOB: 1980/11/18, 37 y.o.   MRN: [id number]  HPI   First visit with me.  Saw Dr. Deirdre Priest when established last month.  1.  Essential Hypertension:  Taking Lisinopril 20 mg daily and HCTZ 25 mg daily.  Feeling better.  2.  Dizzy episodes:  Started 1 year ago.  Thought is was because of his use of MJ.  Stopped smoking MJ and did not resolve. At the time this started, he was having nasal congestion and started using Oxymetolozone nasal spray.   He has dizziness, like he is moving in the room even if trying to be still, but also notes a problem fairly regularly if looks to the right. He also notes a pressure on top of his head that seems to precede the dizziness. He wears a baseball cap regularly and if takes off, seems he has more pressure and dizziness. He feels the episodes are worsening with time. He is treated for anxiety and others keep telling him must be related to his anxiety.   He has had cerumen impaction in both ears, but feels he cleared this out significantly and can hear better, but still with dizziness. No tinnitus.  Anxiety:  Risperdone  0.5 mg twice daily.  Citalopram 10 mg daily, he thinks.  Follows with Archivist at [place].  Snores loudly.  Sounds like he does have apneic episodes.  Current Meds  Medication Sig  . [DISCONTINUED] hydrochlorothiazide (HYDRODIURIL) 25 MG tablet Take 1 tablet (25 mg total) by mouth daily.  . [DISCONTINUED] lisinopril (PRINIVIL,ZESTRIL) 20 MG tablet Take 1 tablet (20 mg total) by mouth daily.   Allergies  Allergen Reactions  . Motrin [Ibuprofen] Hives  . Lactose Intolerance (Gi) Diarrhea    Past Medical History:  Diagnosis Date  . Depression   . Hyperchloremia   . Hypertension    No past surgical history on file.   No family history on file.   Social History   Socioeconomic History  . Marital status: Single    Spouse name: Not on file  . Number of children: Not  on file  . Years of education: Not on file  . Highest education level: Not on file  Occupational History  . Not on file  Social Needs  . Financial resource strain: Not on file  . Food insecurity:    Worry: Not on file    Inability: Not on file  . Transportation needs:    Medical: Not on file    Non-medical: Not on file  Tobacco Use  . Smoking status: Current Some Day Smoker    Types: Cigars  . Smokeless tobacco: Never Used  Substance and Sexual Activity  . Alcohol use: Yes    Comment: occ  . Drug use: Yes    Types: Marijuana  . Sexual activity: Not on file  Lifestyle  . Physical activity:    Days per week: Not on file    Minutes per session: Not on file  . Stress: Not on file  Relationships  . Social connections:    Talks on phone: Not on file    Gets together: Not on file    Attends religious service: Not on file    Active member of club or organization: Not on file    Attends meetings of clubs or organizations: Not on file    Relationship status: Not on file  . Intimate partner violence:    Fear of  current or ex partner: Not on file    Emotionally abused: Not on file    Physically abused: Not on file    Forced sexual activity: Not on file  Other Topics Concern  . Not on file  Social History Narrative  . Not on file    Review of Systems     Objective:   Physical Exam NAD HEENT:  Big muscles on either side of head--this is wear he gets the headpressure, mildly tender.  PERRL, EOMI, discs sharp, Bilateral cerumen impaction, nasal mucosa red and swollen with yellow green nasal discharge.  Nontender over frontal and maxillary sinus areas.  Throat without injection. Neck:  Supple, No adenopathy Chest:  CTA CV:  RRR without murmur or rub.  Radial and DP pulses normal and equal.        Assessment & Plan:  1.  Sinusitis:  Augmentin 850/125 mg twice daily for 14 days.  Flonase 2 sprays each nostril daily. Suspect dizziness related to sinusitis. Stop  Afrin.  2.  Morbid obesity:  Discussed diet and physical activity with goals.  3.  Probable OSA:   Apply for [facility] financial assistance and then sleep study.  To notify clinic when he has received coverage.  4.  Hypertension:  Controlled.  5.  Cerumen impaction:  Follow up this week for cerumen impaction removal.  Recommended Debrox for 2 days prior.

## 2018-04-02 LAB — LIPID PANEL W/O CHOL/HDL RATIO
Cholesterol, Total: 222 mg/dL — ABNORMAL HIGH (ref 100–199)
HDL: 40 mg/dL (ref >39)
LDL CALC: 129 mg/dL — AB (ref 0–99)
Triglycerides: 265 mg/dL — ABNORMAL HIGH (ref 0–149)
VLDL CHOLESTEROL CAL: 53 mg/dL — AB (ref 5–40)

## 2018-04-13 ENCOUNTER — Ambulatory Visit: Payer: Self-pay | Admitting: Internal Medicine

## 2018-04-13 ENCOUNTER — Encounter: Payer: Self-pay | Admitting: Internal Medicine

## 2018-04-13 VITALS — BP 130/80 | HR 78 | Resp 12 | Ht 62.5 in | Wt 244.0 lb

## 2018-04-13 DIAGNOSIS — R42 Dizziness and giddiness: Secondary | ICD-10-CM

## 2018-04-13 DIAGNOSIS — H6123 Impacted cerumen, bilateral: Secondary | ICD-10-CM

## 2018-04-13 DIAGNOSIS — I10 Essential (primary) hypertension: Secondary | ICD-10-CM

## 2018-04-13 MED ORDER — LISINOPRIL-HYDROCHLOROTHIAZIDE 20-25 MG PO TABS: 1.0000 | ORAL_TABLET | Freq: Every day | ORAL | 3 refills | Status: DC

## 2018-04-13 NOTE — Progress Notes (Signed)
   Subjective:    Patient ID: Donnetta SimpersLance Alexander Kerkhoff, male    DOB: Jun 12, 1981, 37 y.o.   MRN: [id number]  HPI   Here for resolution of bilateral cerumen ear impaction.  He has been placing sweet oil in his ears regularly since last visit.  States dizziness is a bit better with Augmentin and corticosteroid nasal spray, but not resolved.    Needs his Lisinopril/HCTZ changed to combined medication  Current Meds  Medication Sig  . amoxicillin-clavulanate (AUGMENTIN) 875-125 MG tablet 1 tab by mouth twice daily for 14 days  . fluticasone (FLONASE) 50 MCG/ACT nasal spray Place 2 sprays into both nostrils daily.  . [DISCONTINUED] hydrochlorothiazide (HYDRODIURIL) 25 MG tablet Take 1 tablet (25 mg total) by mouth daily.  . [DISCONTINUED] lisinopril (PRINIVIL,ZESTRIL) 20 MG tablet Take 1 tablet (20 mg total) by mouth daily.    Allergies  Allergen Reactions  . Motrin [Ibuprofen] Hives     Review of Systems     Objective:   Physical Exam Bilateral ear impaction:  Used both irrigation and curettage to remove cerumen bilaterally.  Canals clear without erythema as were TMs upon completion.  Had some dizziness with irrigation, but otherwise tolerated well with report of much improved hearing after resolution.       Assessment & Plan:  1.  Cerumen impaction, bilateral:  Resolved  2.  Hypertension:  Change to combined Lisinopril/HCTZ for lower cost.  3.  Sinusitis/vertigo:  Improving with Augmentin and nasal fluticasone.  Follow up in 2 months to see if makes long term difference.

## 2018-04-13 NOTE — Patient Instructions (Signed)
Mix white vinegar and rubbing alcohol 50:50 and place 4 drops in each ear canal and immediately allow to drain.

## 2018-05-22 ENCOUNTER — Emergency Department (HOSPITAL_COMMUNITY): Payer: Self-pay

## 2018-05-22 ENCOUNTER — Emergency Department (HOSPITAL_COMMUNITY)
Admission: EM | Admit: 2018-05-22 | Discharge: 2018-05-22 | Disposition: A | Discharge status: Home / Self Care | Payer: Self-pay | Attending: Emergency Medicine | Admitting: Emergency Medicine

## 2018-05-22 DIAGNOSIS — R079 Chest pain, unspecified: Secondary | ICD-10-CM | POA: Insufficient documentation

## 2018-05-22 DIAGNOSIS — I10 Essential (primary) hypertension: Secondary | ICD-10-CM | POA: Insufficient documentation

## 2018-05-22 DIAGNOSIS — Z79899 Other long term (current) drug therapy: Secondary | ICD-10-CM | POA: Insufficient documentation

## 2018-05-22 DIAGNOSIS — F17200 Nicotine dependence, unspecified, uncomplicated: Secondary | ICD-10-CM | POA: Insufficient documentation

## 2018-05-22 LAB — CBC
HCT: 49.8 % (ref 39.0–52.0)
HEMOGLOBIN: 16.5 g/dL (ref 13.0–17.0)
MCH: 30 pg (ref 26.0–34.0)
MCHC: 33.1 g/dL (ref 30.0–36.0)
MCV: 90.5 fL (ref 78.0–100.0)
Platelets: 295 10*3/uL (ref 150–400)
RBC: 5.5 MIL/uL (ref 4.22–5.81)
RDW: 11.9 % (ref 11.5–15.5)
WBC: 6.3 10*3/uL (ref 4.0–10.5)

## 2018-05-22 LAB — I-STAT TROPONIN, ED
TROPONIN I, POC: 0.01 ng/mL (ref 0.00–0.08)
Troponin i, poc: 0 ng/mL (ref 0.00–0.08)

## 2018-05-22 LAB — BASIC METABOLIC PANEL
Anion gap: 10 (ref 5–15)
BUN: 13 mg/dL (ref 6–20)
CO2: 23 mmol/L (ref 22–32)
Calcium: 9.6 mg/dL (ref 8.9–10.3)
Chloride: 104 mmol/L (ref 98–111)
Creatinine, Ser: 1.28 mg/dL — ABNORMAL HIGH (ref 0.61–1.24)
GFR calc Af Amer: >60 mL/min (ref >60)
GFR calc non Af Amer: >60 mL/min (ref >60)
GLUCOSE: 96 mg/dL (ref 70–99)
Potassium: 4.1 mmol/L (ref 3.5–5.1)
SODIUM: 137 mmol/L (ref 135–145)

## 2018-05-22 NOTE — ED Provider Notes (Signed)
[facility] EMERGENCY DEPARTMENT Provider Note   CSN: [id number] Arrival date & time: 05/22/18  1719   History   Chief Complaint No chief complaint on file.   HPI Samuel Austin is a 37 y.o. male.  HPI  Patient w/ hx of hyperlipidemia, anxiety, HTN and cocaine use presents with  Complaints of intermittent exertional chest pain for 5 months and for 5 days of exertional "numbness and tingling" in left arm and left face.   Chest pain is left sided and non-radiating, he thinks it is usually exertional and will last for ~73min at a time going away when sits still and relaxes.  He has had multiple examinations for this and his chest pain symptoms are not changing in the last 5 days.  His new complaint during that time frame is left arm tingling/numbness and left facial tingling/numbness.  He denies any new weakness or paralysis in his arm, just saying that "it feels wrong".  He denies objectively visual facial drooping or slurring of speech but says that he feels like his cheek is sleeping.  He has no respiratory complaints.  He did think yesterday that his new symtpoms might be because he was unhealthy so he decided to exercise and went walking and did pushups with no symptoms.  He says he is regular with his BP meds but hasn't been taking a prior prescribed cholesterol medicine.  He used cocaine on Wednesday (this was after his new symptoms) but denies other illicit substances.      Past Medical History:  Diagnosis Date  . Depression   . Hyperchloremia   . Hypertension     Patient Active Problem List   Diagnosis Date Noted  . Dizziness 04/13/2018  . Hypertension 03/11/2018  . Hyperlipidemia 03/11/2018  . Episodic mood disorder (HCC) 03/11/2018    No past surgical history on file.      Home Medications    Prior to Admission medications   Medication Sig Start Date End Date Taking? Authorizing Provider  citalopram (CELEXA) 10 MG tablet Take 10 mg by  mouth daily.   Yes [provider]  fluticasone (FLONASE) 50 MCG/ACT nasal spray Place 2 sprays into both nostrils daily. 04/01/18  Yes Julieanne Manson, MD  lisinopril-hydrochlorothiazide (PRINZIDE,ZESTORETIC) 20-25 MG tablet Take 1 tablet by mouth daily. 04/13/18  Yes Julieanne Manson, MD  risperiDONE (RISPERDAL) 0.5 MG tablet Take 0.5 mg by mouth 2 (two) times daily.   Yes [provider]  UNKNOWN TO PATIENT Take 1 tablet by mouth See admin instructions. Med unknown to patient for mood/nightmares: Take 1 tablet by mouth at bedtime as needed for nightmares or mood   Yes [provider]  amoxicillin-clavulanate (AUGMENTIN) 875-125 MG tablet 1 tab by mouth twice daily for 14 days Patient not taking: Reported on 05/22/2018 04/01/18   Julieanne Manson, MD    Family History No family history on file.  Social History Social History   Tobacco Use  . Smoking status: Current Some Day Smoker  . Smokeless tobacco: Never Used  Substance Use Topics  . Alcohol use: Yes  . Drug use: Yes    Types: Marijuana     Allergies   Motrin [ibuprofen] and Lactose intolerance (gi)   Review of Systems Review of Systems  Constitutional: Negative for appetite change, chills, diaphoresis, fatigue and fever.  HENT: Negative for drooling, ear pain, facial swelling, sore throat, tinnitus and trouble swallowing.   Respiratory: Positive for chest tightness. Negative for apnea, cough, shortness of  breath and wheezing.   Cardiovascular: Positive for chest pain. Negative for palpitations.  Gastrointestinal: Negative for abdominal pain, constipation, diarrhea, nausea and vomiting.  Genitourinary: Negative.   Musculoskeletal: Negative.  Negative for gait problem.  Skin: Negative for pallor, rash and wound.  Neurological: Negative for dizziness, seizures, syncope, facial asymmetry, speech difficulty, weakness, light-headedness, numbness and headaches.       No paresthesia, feels  "numbness/tingling" in left arm and left face intermittently  Psychiatric/Behavioral: Negative for agitation, behavioral problems and confusion. The patient is nervous/anxious.      Physical Exam Updated Vital Signs BP 105/62   Pulse 78   Temp 97.6 F (36.4 C) (Oral)   Resp (!) 21   SpO2 100%   Physical Exam  Constitutional: He appears well-developed and well-nourished. No distress.  HENT:  Head: Normocephalic and atraumatic.  Eyes: Pupils are equal, round, and reactive to light. Conjunctivae and EOM are normal. No scleral icterus.  Neck: Normal range of motion.  Cardiovascular: Regular rhythm.  No murmur heard. Pulmonary/Chest: Effort normal and breath sounds normal. He has no wheezes.  Tachypnea ~22  Abdominal: Soft. He exhibits no distension. There is no tenderness. There is no guarding.  Neurological: He is alert. No cranial nerve deficit or sensory deficit. He exhibits normal muscle tone.  Skin: Skin is warm and dry. Capillary refill takes less than 2 seconds. No rash noted. He is not diaphoretic. No erythema. No pallor.  Psychiatric: He has a normal mood and affect. His behavior is normal. Judgment and thought content normal.     ED Treatments / Results  Labs (all labs ordered are listed, but only abnormal results are displayed) Labs Reviewed  BASIC METABOLIC PANEL - Abnormal; Notable for the following components:      Result Value   Creatinine, Ser 1.28 (*)    All other components within normal limits  CBC  I-STAT TROPONIN, ED    EKG EKG Interpretation  Date/Time:  Saturday May 22 2018 17:30:35 EDT Ventricular Rate:  107 PR Interval:    QRS Duration: 92 QT Interval:  338 QTC Calculation: 451 R Axis:   81 Text Interpretation:  Sinus tachycardia Abnormal R-wave progression, early transition Borderline repolarization abnormality Confirmed by Margarita Grizzle [phone]) on 05/22/2018 5:48:13 PM   Radiology No results found.  Procedures Procedures  (including critical care time)  Medications Ordered in ED Medications - No data to display   Initial Impression / Assessment and Plan / ED Course  I have reviewed the triage vital signs and the nursing notes.  Pertinent labs & imaging results that were available during my care of the patient were reviewed by me and considered in my medical decision making (see chart for details).   Patient with multiple risk factors for cardiac event (heart score 4 with troponin still pending), ECG without stemi but did have repolarization abnormality.  Patient hemodynamically stable in room and comfortable so we will get basic labs/trop. No objective neurological deficits at this point to indicate need to stroke workup, time frame of new subjective symptoms well outside TPA window at 5 days  Initial istat troponin neg, patient stable.  Will observe for 3hr delta troponin  3hr delta trop negative, CXR neg.  Patient comfortable in room and we discussed return precautions as well as benefits of close pcp followup, diet and activity improvements, drug cessation and patient agrees with plan to d/c.  Final Clinical Impressions(s) / ED Diagnoses   Final diagnoses:  None    ED  Discharge Orders    None       Marthenia Rolling, DO 05/22/18 2157    Margarita Grizzle, MD 05/24/18 [phone]

## 2018-05-22 NOTE — ED Notes (Signed)
PT states understanding of care given, follow up care. PT ambulated from ED to car with a steady gait.  

## 2018-05-22 NOTE — ED Triage Notes (Signed)
Arrives from home with 2 day complaint of chest pain beginning as heartburn and numbness in L arm. EMS reports approximately 30 min PTA pt's symptoms got worse. Hx HTN and anxiety. NSR for EMS, 12 lead unremarkable 131/90, 91 HR, RR 22-40 (intermittent hyperventilation)  97& RA 20 ALC

## 2018-05-22 NOTE — Discharge Instructions (Signed)
Please return to see a doctor if you develop significant chest pain or problems breathing.

## 2018-05-27 ENCOUNTER — Ambulatory Visit: Payer: Self-pay | Admitting: Internal Medicine

## 2018-05-27 ENCOUNTER — Encounter: Payer: Self-pay | Admitting: Internal Medicine

## 2018-05-27 VITALS — BP 118/84 | HR 72 | Resp 12 | Ht 62.5 in | Wt 242.0 lb

## 2018-05-27 DIAGNOSIS — R079 Chest pain, unspecified: Secondary | ICD-10-CM

## 2018-05-27 DIAGNOSIS — F959 Tic disorder, unspecified: Secondary | ICD-10-CM

## 2018-05-27 DIAGNOSIS — F319 Bipolar disorder, unspecified: Secondary | ICD-10-CM

## 2018-05-27 MED ORDER — FAMOTIDINE 20 MG PO TABS: ORAL_TABLET | ORAL | 4 refills | Status: DC

## 2018-05-27 MED ORDER — ASPIRIN EC 81 MG PO TBEC: 81.0000 mg | DELAYED_RELEASE_TABLET | Freq: Every day | ORAL | Status: DC

## 2018-05-27 NOTE — Progress Notes (Signed)
Subjective:    Patient ID: Samuel Austin, male    DOB: 1980-10-17, 37 y.o.   MRN: [id number]  HPI   37 yo male with hypertension, obesity, hyperlipidemia,   1.  Constellation of symptoms for which he was sent to ED last Saturday, Sept 28th. Burning in his esophagus the day before.  Belching.  Worse that night when lay down to sleep.  Chewing up Tums and drinking a lot of water.   Heartburn better when awakened Saturday morning. Ate breakfast and heartburn really flared.   Was sitting forward watching TV and developed pins and needles in left arm. Went to bedroom to try and calm and then similar symptoms in his left leg.   Then noted he was breathing fast, heart was beating fast, felt like he had a pulse in his head and arms. Becomes very animated describing. Spoke with me over the phone and decision was made to have him go to hospital. Very anxious about dying on way to hospital in ambulance.  ECGs showed sinus tachycardia with early transition of R wave progression.  Baseline with a lot of interference. Cardiac enzymes normal CXR fine. Felt to be having anxiety.  Concerned as his maternal grandfather died due to heart disease and had MIs.  Paternal grandfather died in past year from MI.  Still having intermittent heartburn for which he is taking Tums.  No smoking for 2 weeks.  Did smoke MJ he believes was laced with something before the episode.  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   2.  Bipolar Disorder from signs of what he states.  Not taking Citalopram or Risperidone regularly.  3. 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  States has had since age 87 yo. Embarrassed about it so tries to hide Not clear he has OCD symptoms.  Does wash his hands repeatedly throughout the day.   Describes writing a name down over and over again until perfect.   Having people repeat things over and over to him again  until he understands. Feels like he has had so many problems due to tics when young.   Followed at [facility] by Annia Friendly [phone] or [phone]. Next visit is 10/24.19  4.  Daughter age 4 yo--has not seen in 1 year.  Mom does not want him in their lives.  He had genetic test done and no relation.  Not clear if not paying child support or not being in child's life is more important to him  Current Meds  Medication Sig  . citalopram (CELEXA) 10 MG tablet Take 10 mg by mouth daily.  . fluticasone (FLONASE) 50 MCG/ACT nasal spray Place 2 sprays into both nostrils daily.  Marland Kitchen lisinopril-hydrochlorothiazide (PRINZIDE,ZESTORETIC) 20-25 MG tablet Take 1 tablet by mouth daily.  . risperiDONE (RISPERDAL) 0.5 MG tablet Take 0.5 mg by mouth 2 (two) times daily.  Marland Kitchen UNKNOWN TO PATIENT Take 1 tablet by mouth See admin instructions. Med unknown to patient for mood/nightmares: Take 1 tablet by mouth at bedtime as needed for nightmares or mood    Allergies  Allergen Reactions  . Motrin [Ibuprofen] Hives  . Lactose Intolerance (Gi) Diarrhea   Review of Systems     Objective:   Physical Exam   Becomes quickly anxious and animated describing his symptoms Intermittent tics with face and eyebrows, head and neck. HEENT: PERRL, EOMI, TMs pearly gray, throat without injection Neck:  Supple, No adenopathy, no thyromegaly Chest:  CTA.  NT over chest wall CV:  RRR without murmur or rub.  Radial and DP pulses normal and equal Abd:  S, NT, No HSM or mass.  + BS LE:  No edema.     Assessment & Plan:  1.  Chest Pain:  Likely related to anxiety and possibly GERD as well.   However, does have multiple risk factors for CAD Continue ASA 81 mg daily with meal Famotidine 40 mg at bedtime nightly Cardiology referral.  2.  Bipolar Disorder:  Not using med consistently.  Encouraged him to do so.  Will try and get hold of caregivers at [facility].  3.  Tic Disorder.  Did not recall Tourette syndrome as diagnosis  today.  Will look into where he might get behavioral training for treatment as well as encouraged to take Risperidone regularly as may help as well.   Sounds like this caused him great mental trauma as a child.   He continues to tighten his muscles whenever he feels the tics coming on, which may also heighten his anxiety.  Would prefer he just work on more relaxed voluntary movements to prevent tics.  Spent 60 minutes face to face with patient.

## 2018-05-27 NOTE — Patient Instructions (Signed)

## 2018-06-09 ENCOUNTER — Emergency Department (HOSPITAL_COMMUNITY)
Admission: EM | Admit: 2018-06-09 | Discharge: 2018-06-09 | Disposition: A | Discharge status: Home / Self Care | Payer: Self-pay | Attending: Emergency Medicine | Admitting: Emergency Medicine

## 2018-06-09 ENCOUNTER — Other Ambulatory Visit: Payer: Self-pay

## 2018-06-09 ENCOUNTER — Encounter (HOSPITAL_COMMUNITY): Payer: Self-pay

## 2018-06-09 ENCOUNTER — Emergency Department (HOSPITAL_COMMUNITY): Payer: Self-pay

## 2018-06-09 DIAGNOSIS — R0789 Other chest pain: Secondary | ICD-10-CM | POA: Insufficient documentation

## 2018-06-09 DIAGNOSIS — F419 Anxiety disorder, unspecified: Secondary | ICD-10-CM | POA: Insufficient documentation

## 2018-06-09 DIAGNOSIS — Z79899 Other long term (current) drug therapy: Secondary | ICD-10-CM | POA: Insufficient documentation

## 2018-06-09 DIAGNOSIS — Z7982 Long term (current) use of aspirin: Secondary | ICD-10-CM | POA: Insufficient documentation

## 2018-06-09 DIAGNOSIS — I10 Essential (primary) hypertension: Secondary | ICD-10-CM | POA: Insufficient documentation

## 2018-06-09 DIAGNOSIS — F17210 Nicotine dependence, cigarettes, uncomplicated: Secondary | ICD-10-CM | POA: Insufficient documentation

## 2018-06-09 LAB — CBC
HEMATOCRIT: 53.3 % — AB (ref 39.0–52.0)
HEMOGLOBIN: 17.7 g/dL — AB (ref 13.0–17.0)
MCH: 29.9 pg (ref 26.0–34.0)
MCHC: 33.2 g/dL (ref 30.0–36.0)
MCV: 90.2 fL (ref 80.0–100.0)
Platelets: 326 10*3/uL (ref 150–400)
RBC: 5.91 MIL/uL — ABNORMAL HIGH (ref 4.22–5.81)
RDW: 12 % (ref 11.5–15.5)
WBC: 8.8 10*3/uL (ref 4.0–10.5)
nRBC: 0 % (ref 0.0–0.2)

## 2018-06-09 LAB — BASIC METABOLIC PANEL
Anion gap: 14 (ref 5–15)
BUN: 14 mg/dL (ref 6–20)
CHLORIDE: 100 mmol/L (ref 98–111)
CO2: 22 mmol/L (ref 22–32)
CREATININE: 1.39 mg/dL — AB (ref 0.61–1.24)
Calcium: 9.8 mg/dL (ref 8.9–10.3)
GFR calc Af Amer: >60 mL/min (ref >60)
GFR calc non Af Amer: >60 mL/min (ref >60)
GLUCOSE: 114 mg/dL — AB (ref 70–99)
Potassium: 3.4 mmol/L — ABNORMAL LOW (ref 3.5–5.1)
Sodium: 136 mmol/L (ref 135–145)

## 2018-06-09 LAB — LIPASE, BLOOD: Lipase: 32 U/L (ref 11–51)

## 2018-06-09 LAB — I-STAT TROPONIN, ED
Troponin i, poc: 0 ng/mL (ref 0.00–0.08)
Troponin i, poc: 0 ng/mL (ref 0.00–0.08)

## 2018-06-09 NOTE — ED Triage Notes (Signed)
Pt endorses sudden on set of shob with CP while watching tv with tightness in the left arm and left side of face. Pt has hx of anxiety attacks and appears very anxious in triage.

## 2018-06-09 NOTE — Discharge Instructions (Signed)
Please read instructions below.  °Return to the ER for new or worsening symptoms; including worsening chest pain, shortness of breath, pain that radiates to the arm or neck, pain or shortness of breath worsened with exertion.  °Follow up with your primary care provider. ° °

## 2018-06-09 NOTE — ED Notes (Signed)
Pt now complaining of severe tightness in his stomach. Pt having panic attack. VSS.

## 2018-06-09 NOTE — ED Provider Notes (Addendum)
[facility] EMERGENCY DEPARTMENT Provider Note   CSN: [id number] Arrival date & time: 06/09/18  1513     History   Chief Complaint Chief Complaint  Patient presents with  . Chest Pain  . Shortness of Breath  . Anxiety    HPI Samuel Austin is a 37 y.o. male with past medical history of depression, anxiety, hypertension, hyperlipidemia, presenting to the emergency department with complaint of sewed of dizziness and chest tightness.  He states he was watching a show on TV, "the doctors," about how anxiety can cause Alzheimer's and he began thinking about his issues with anxiety when his symptoms began.  He states he felt dizzy and then sweaty.  He states he then went to lay down in his bedroom with continued dizziness shortly followed by shortness of breath and pressure/tightness in his head, chest, and abdomen.  He states he was feeling terrible with tingling in his hands and arms into his left face as well.  He drove himself to the ED and symptoms have been improving since then though still feels a little bit lightheaded.  Patient has been seen multiple times in the ED for similar episodes which patient states today feels similar as well however worse.  He has had negative cardiac work-ups those visits.  He is followed by his primary care who prescribed him anxiety medications though has not been taking it for a few weeks now.  PCP is also referring him to cardiology for outpatient work-up.  He states he worries about panic attacks happening during his everyday life and it causes him more stress.  States he is been improving his diet lately with attempts to be healthy however these episodes continue.  No cardiac history.  No history of DVT/PE, no hemoptysis, recent trauma, travel, surgery.  He does not feel short of breath in the ED.  Denies recent drug or alcohol use.  The history is provided by the patient.    Past Medical History:  Diagnosis Date  . Depression    . Hyperchloremia   . Hypertension     Patient Active Problem List   Diagnosis Date Noted  . Dizziness 04/13/2018  . Hypertension 03/11/2018  . Hyperlipidemia 03/11/2018  . Episodic mood disorder (HCC) 03/11/2018    History reviewed. No pertinent surgical history.      Home Medications    Prior to Admission medications   Medication Sig Start Date End Date Taking? Authorizing Provider  aspirin EC 81 MG tablet Take 1 tablet (81 mg total) by mouth daily. 05/27/18  Yes Julieanne Manson, MD  lisinopril-hydrochlorothiazide (PRINZIDE,ZESTORETIC) 20-25 MG tablet Take 1 tablet by mouth daily. 04/13/18  Yes Julieanne Manson, MD  citalopram (CELEXA) 10 MG tablet Take 10 mg by mouth daily.    [provider]  famotidine (PEPCID) 20 MG tablet 2 tabs by mouth at bedtime Patient not taking: Reported on 06/09/2018 05/27/18   Julieanne Manson, MD  fluticasone [facility]) 50 MCG/ACT nasal spray Place 2 sprays into both nostrils daily. Patient not taking: Reported on 06/09/2018 04/01/18   Julieanne Manson, MD  risperiDONE (RISPERDAL) 0.5 MG tablet Take 0.5 mg by mouth 2 (two) times daily.    [provider]  UNKNOWN TO PATIENT Take 1 tablet by mouth See admin instructions. Med unknown to patient for mood/nightmares: Take 1 tablet by mouth at bedtime as needed for nightmares or mood    [provider]    Family History History reviewed. No pertinent family history.  Social History Social History   Tobacco Use  . Smoking status: Current Some Day Smoker    Types: Cigars  . Smokeless tobacco: Never Used  Substance Use Topics  . Alcohol use: Yes    Comment: occ  . Drug use: Yes    Types: Marijuana     Allergies   Motrin [ibuprofen] and Lactose intolerance (gi)   Review of Systems Review of Systems  Constitutional: Positive for diaphoresis. Negative for fever.  Eyes: Negative for visual disturbance.  Respiratory: Positive for shortness of breath.     Cardiovascular: Positive for chest pain. Negative for palpitations.  Gastrointestinal: Negative for nausea and vomiting.       Abdominal tightness  Neurological: Positive for dizziness and headaches.  Psychiatric/Behavioral: The patient is nervous/anxious.   All other systems reviewed and are negative.    Physical Exam Updated Vital Signs BP (!) 134/104 (BP Location: Right Arm)   Pulse (!) 106   Temp 98.3 F (36.8 C) (Oral)   Resp (!) 26   Ht 5\' 4"  (1.626 m)   Wt 108 kg   SpO2 100%   BMI 40.85 kg/m   Physical Exam  Constitutional: He is oriented to person, place, and time. He appears well-developed and well-nourished. He does not appear ill.  pt seems anxious on evaluation. Speech is slightly pressured.  HENT:  Head: Normocephalic and atraumatic.  Eyes: Pupils are equal, round, and reactive to light. Conjunctivae and EOM are normal.  Neck: Normal range of motion. Neck supple.  Cardiovascular: Regular rhythm, normal heart sounds and intact distal pulses.  Slightly tachycardic.  Pulmonary/Chest: Effort normal and breath sounds normal. No respiratory distress.  Abdominal: Soft. Bowel sounds are normal. He exhibits no distension. There is no tenderness. There is no guarding.  Musculoskeletal: He exhibits no edema.  Neurological: He is alert and oriented to person, place, and time.  Mental Status:  Alert, oriented, thought content appropriate, able to give a coherent history. Speech fluent without evidence of aphasia. Able to follow 2 step commands without difficulty.  Cranial Nerves:  II:  Peripheral visual fields grossly normal, pupils equal, round, reactive to light III,IV, VI: ptosis not present, extra-ocular motions intact bilaterally  V,VII: smile symmetric, facial light touch sensation equal VIII: hearing grossly normal to voice  X: uvula elevates symmetrically  XI: bilateral shoulder shrug symmetric and strong XII: midline tongue extension without  fassiculations Motor:  Normal tone. 5/5 in upper and lower extremities bilaterally including strong and equal grip strength and dorsiflexion/plantar flexion Sensory: Pinprick and light touch normal in all extremities.  Deep Tendon Reflexes: 2+ and symmetric in the biceps and patella Cerebellar: normal finger-to-nose with bilateral upper extremities CV: distal pulses palpable throughout    Skin: Skin is warm.  Psychiatric: His behavior is normal.  Nursing note and vitals reviewed.    ED Treatments / Results  Labs (all labs ordered are listed, but only abnormal results are displayed) Labs Reviewed  CBC - Abnormal; Notable for the following components:      Result Value   RBC 5.91 (*)    Hemoglobin 17.7 (*)    HCT 53.3 (*)    All other components within normal limits  BASIC METABOLIC PANEL - Abnormal; Notable for the following components:   Potassium 3.4 (*)    Glucose, Bld 114 (*)    Creatinine, Ser 1.39 (*)    All other components within normal limits  LIPASE, BLOOD  I-STAT TROPONIN, ED  I-STAT TROPONIN, ED  EKG EKG Interpretation  Date/Time:  Wednesday June 09 2018 15:16:35 EDT Ventricular Rate:  101 PR Interval:  162 QRS Duration: 102 QT Interval:  362 QTC Calculation: 469 R Axis:   56 Text Interpretation:  Sinus tachycardia Otherwise normal ECG No significant change since last tracing Confirmed by Gwyneth Sprout ([id number]) on 06/09/2018 4:15:19 PM   Radiology Dg Chest 2 View  Result Date: 06/09/2018 CLINICAL DATA:  37 year old male with sudden onset chest pain and shortness of breath with diaphoresis. Smoker. EXAM: CHEST - 2 VIEW COMPARISON:  Chest radiographs 05/22/2018 and earlier. FINDINGS: Lung volumes and mediastinal contours are stable and within normal limits. Visualized tracheal air column is within normal limits. Stable mild bilateral pulmonary interstitial markings with no pneumothorax, pleural effusion or acute pulmonary opacity. No acute osseous  abnormality identified. Negative visible bowel gas pattern. IMPRESSION: No acute cardiopulmonary abnormality. Stable mild pulmonary interstitial markings probably related to smoking. Electronically Signed   By: Odessa Fleming M.D.   On: 06/09/2018 16:26    Procedures Procedures (including critical care time)  Medications Ordered in ED Medications - No data to display   Initial Impression / Assessment and Plan / ED Course  I have reviewed the triage vital signs and the nursing notes.  Pertinent labs & imaging results that were available during my care of the patient were reviewed by me and considered in my medical decision making (see chart for details).     Pt presenting with episode of dizziness, diaphoresis, chest and abdominal pressure, tingling in hands and face after watching TV show about anxiety and the possibility of leading to alzheimers. He has had multiple similar episodes in the past with neg cardiac workups. Today, chest pain and sx most likely related to anxiety, given patient's hx of panic attacks. Chest pain is not likely of cardiac or pulmonary etiology d/t presentation, VSS, no tracheal deviation, no JVD or new murmur, RRR, breath sounds equal bilaterally, EKG without acute abnormalities, negative troponin x2, and negative CXR. Slightly tachycardic, though patient with similar tachycardia during last ED visit and patient appears anxious on evaluation. No risk factors for PE, SOB was episodic and symptoms improved without intervention. Low suspicion for PE. Patient is to be discharged with recommendation to follow up with PCP in regards to today's hospital visit. Pt has been advised to return to the ED if CP becomes exertional, associated with diaphoresis or nausea, radiates to left jaw/arm, worsens or becomes concerning in any way. Discussed lifestyle modifications to improve anxiety. Pt appears reliable for follow up and is agreeable to discharge.   Pt discussed with Dr.  Anitra Lauth.  Discussed results, findings, treatment and follow up. Patient advised of return precautions. Patient verbalized understanding and agreed with plan.   Final Clinical Impressions(s) / ED Diagnoses   Final diagnoses:  Anxiety  Atypical chest pain    ED Discharge Orders    None       Robinson, [place] N, PA-C 06/09/18 1946    Robinson, [place] N, PA-C 06/09/18 1947    Gwyneth Sprout, MD 06/09/18 2332

## 2018-06-14 ENCOUNTER — Ambulatory Visit: Payer: Self-pay | Admitting: Internal Medicine

## 2018-06-16 ENCOUNTER — Encounter: Payer: Self-pay | Admitting: Internal Medicine

## 2018-06-16 ENCOUNTER — Ambulatory Visit (INDEPENDENT_AMBULATORY_CARE_PROVIDER_SITE_OTHER): Payer: No Typology Code available for payment source | Admitting: Internal Medicine

## 2018-06-16 VITALS — BP 130/80 | HR 90 | Ht 61.0 in | Wt 239.6 lb

## 2018-06-16 DIAGNOSIS — R0683 Snoring: Secondary | ICD-10-CM

## 2018-06-16 DIAGNOSIS — E782 Mixed hyperlipidemia: Secondary | ICD-10-CM

## 2018-06-16 DIAGNOSIS — R0789 Other chest pain: Secondary | ICD-10-CM

## 2018-06-16 DIAGNOSIS — I10 Essential (primary) hypertension: Secondary | ICD-10-CM

## 2018-06-16 MED ORDER — METOPROLOL TARTRATE 100 MG PO TABS: ORAL_TABLET | ORAL | 0 refills | Status: DC

## 2018-06-16 NOTE — Patient Instructions (Signed)
Medication Instructions:  Your physician has recommended you make the following change in your medication:  Take 1 tablet of Metoprolol 100mg  2 hours prior to your CT. An Rx has been sent to your pharmacy.  If you need a refill on your cardiac medications before your next appointment, please call your pharmacy.   Lab work: Your physician recommends that you return for lab work [employer], [employer]ellery) a couple of days prior to your CT  If you have labs (blood work) drawn today and your tests are completely normal, you will receive your results only by: Marland Kitchen MyChart Message (if you have MyChart) OR . A paper copy in the mail If you have any lab test that is abnormal or we need to change your treatment, we will call you to review the results.  Testing/Procedures: Non-Cardiac CT Angiography (CTA), is a special type of CT scan that uses a computer to produce multi-dimensional views of major blood vessels throughout the body. In CT angiography, a contrast material is injected through an IV to help visualize the blood vessels  Your physician has requested that you have an echocardiogram. Echocardiography is a painless test that uses sound waves to create images of your heart. It provides your doctor with information about the size and shape of your heart and how well your heart's chambers and valves are working. This procedure takes approximately one hour. There are no restrictions for this procedure.  Your physician has recommended that you have a sleep study. This test records several body functions during sleep, including: brain activity, eye movement, oxygen and carbon dioxide blood levels, heart rate and rhythm, breathing rate and rhythm, the flow of air through your mouth and nose, snoring, body muscle movements, and chest and belly movement.     Follow-Up: At [facility], you and your health needs are our priority.  As part of our continuing mission to provide you with exceptional heart care, we have created  designated Provider Care Teams.  These Care Teams include your primary Cardiologist (physician) and Advanced Practice Providers (APPs -  Physician Assistants and Nurse Practitioners) who all work together to provide you with the care you need, when you need it. You will need a follow up appointment in 3 months. (Please schedule in a 10am of 10:40am slot)  Please call our office 2 months in advance to schedule this appointment.  You may see Dr.Acharya or one of the following Advanced Practice Providers on your designated Care Team:   Theodore Demark, PA-C . Joni Reining, DNP, ANP  Any Other Special Instructions Will Be Listed Below (If Applicable).   Please arrive at the [facility] main entrance of [facility] TBD AM (30-45 minutes prior to test start time)  Please come to our office a couple of days prior to your CT for labs.  [facility] [address] [place], [place] [address] [phone]  Proceed to the [facility] Radiology Department (First Floor).  Please follow these instructions carefully (unless otherwise directed):  Hold all erectile dysfunction medications at least 48 hours prior to test.  On the Night Before the Test: . Be sure to Drink plenty of water. . Do not consume any caffeinated/decaffeinated beverages or chocolate 12 hours prior to your test. . Do not take any antihistamines 12 hours prior to your test. I On the Day of the Test: . Drink plenty of water. Do not drink any water within one hour of the test. . Do not eat any food  4 hours prior to the test. . You may take your regular medications prior to the test.  . Take metoprolol (Lopressor) two hours prior to test.        After the Test: . Drink plenty of water. . After receiving IV contrast, you may experience a mild flushed feeling. This is normal. . On occasion, you may experience a mild rash up to 24 hours after the test. This is not dangerous. If this occurs, you can take  Benadryl 25 mg and increase your fluid intake. . If you experience trouble breathing, this can be serious. If it is severe call 911 IMMEDIATELY. If it is mild, please call our office. . If you take any of these medications: Glipizide/Metformin, Avandament, Glucavance, please do not take 48 hours after completing test.

## 2018-06-16 NOTE — Consult Note (Signed)
Cardiology Office Note:    Date:  06/16/2018   ID:  Samuel Austin, DOB 03/22/1981, MRN [id number]  PCP:  Julieanne Manson, MD  Cardiologist:  No primary care provider on file.  Electrophysiologist:  None   Referring MD: Julieanne Manson, MD   ED follow up for atypical chest pain  History of Present Illness:    Samuel Austin is a 37 y.o. male with a hx of depression, anxiety, hypertension, and hyperlipidemia. He had a recent ED visit where he ruled out for MI.  He describes symptoms of catching sensation in his chest which feels like a skipped beat, as well as palpitations and tachycardia and a tingling down his left arm.  It is accompanied by shortness of breath and a feeling of panic.  His ECG in the ED was nonischemic and showed sinus tachycardia.  His ECG today shows normal sinus rhythm.  His grandfather passed from comp occasions of heart disease last year and since that time he has been very worried that he may suffer the same fate.  He recognizes that he is overweight and needs to modify his lifestyle factors.  He tells me he has changed his diet eating more wheat bread, drinking water, lessening his soda intake, attempting to avoid salt, cutting out fatty foods from his diet.  I have commended him on his efforts to improve his diet.  In addition to that he is started to intermittently exercise with jogging in place at home as well as doing some sit ups.  He denies exertional chest discomfort, palpitations with exertion.  He describes shortness of breath at rest and with activity, PND with frequent awakening gasping for air, orthopnea, heavy snoring, and an episode of hyperventilation which may have led to syncope.  He denies lower extremity edema, and denies other episodes of syncope in his lifetime.  He has frequent presyncope.  He smokes 1 to 2 cigarettes a day and has done so for the past 25 years.  He describes palpitations particularly in conjunction with  use of smoked marijuana, which she has stopped.  Past Medical History:  Diagnosis Date  . Depression   . Hyperchloremia   . Hypertension     No past surgical history on file.  Current Medications: Current Meds  Medication Sig  . aspirin EC 81 MG tablet Take 1 tablet (81 mg total) by mouth daily.  Marland Kitchen lisinopril-hydrochlorothiazide (PRINZIDE,ZESTORETIC) 20-25 MG tablet Take 1 tablet by mouth daily.     Allergies:   Motrin [ibuprofen] and Lactose intolerance (gi)   Social History   Socioeconomic History  . Marital status: Single    Spouse name: Not on file  . Number of children: Not on file  . Years of education: Not on file  . Highest education level: Not on file  Occupational History  . Not on file  Social Needs  . Financial resource strain: Not on file  . Food insecurity:    Worry: Not on file    Inability: Not on file  . Transportation needs:    Medical: Not on file    Non-medical: Not on file  Tobacco Use  . Smoking status: Current Some Day Smoker    Types: Cigars  . Smokeless tobacco: Never Used  Substance and Sexual Activity  . Alcohol use: Yes    Comment: occ  . Drug use: Yes    Types: Marijuana  . Sexual activity: Not on file  Lifestyle  . Physical activity:    Days  per week: Not on file    Minutes per session: Not on file  . Stress: Not on file  Relationships  . Social connections:    Talks on phone: Not on file    Gets together: Not on file    Attends religious service: Not on file    Active member of club or organization: Not on file    Attends meetings of clubs or organizations: Not on file    Relationship status: Not on file  Other Topics Concern  . Not on file  Social History Narrative  . Not on file     Family History: The patient's family history is significant for heart disease in his grandfather. ROS:   Please see the history of present illness.    All other systems reviewed and are negative.  EKGs/Labs/Other Studies Reviewed:      The following studies were reviewed today:  EKG:  EKG is ordered today.  The ekg ordered today demonstrates normal sinus rhythm, ventricular rate 90.  Recent Labs: 06/09/2018: BUN 14; Creatinine, Ser 1.39; Hemoglobin 17.7; Platelets 326; Potassium 3.4; Sodium 136  Recent Lipid Panel    Component Value Date/Time   CHOL 222 (H) 04/01/2018 0909   TRIG 265 (H) 04/01/2018 0909   HDL 40 04/01/2018 0909   LDLCALC 129 (H) 04/01/2018 0909    Physical Exam:    VS:  BP 130/80   Pulse 90   Ht 5\' 1"  (1.549 m)   Wt 239 lb 9.6 oz (108.7 kg)   BMI 45.27 kg/m     Wt Readings from Last 3 Encounters:  06/16/18 239 lb 9.6 oz (108.7 kg)  06/09/18 238 lb (108 kg)  05/27/18 242 lb (109.8 kg)     GEN: Well nourished, well developed in no acute distress HEENT: Normal NECK: No JVD; No carotid bruits CARDIAC: RRR, no murmurs, rubs, gallops RESPIRATORY:  Clear to auscultation without rales, wheezing or rhonchi  ABDOMEN: Soft, non-tender, non-distended MUSCULOSKELETAL:  No edema; No deformity  SKIN: Warm and dry NEUROLOGIC:  Alert and oriented x 3 PSYCHIATRIC: Appears anxious over his medical condition.  ASSESSMENT:    1. Atypical chest pain   2. Snoring   3. Hypertension, unspecified type   4. Mixed hyperlipidemia    PLAN:    In order of problems listed above: Samuel Austin is quite nervous about his medical condition that he will have a heart attack given his risk factors. This is reassuring and I have communicated that to him.  However he is having left chest symptoms and dyspnea on exertion, so we will define his coronary anatomy with a coronary CT scan.  If there are intermediate lesions we will consider CT FFR for functional testing.  His hemoglobin is elevated and he endorses significant snoring with an Epworth Sleepiness Scale of 16.  I am concerned that he may have sleep apnea and we will send him for a sleep study to screen for this.  He is familiar with this since his mother has  a CPAP.  Finally with his dyspnea on exertion it would be reasonable to obtain an echocardiogram to ensure there are no structural changes to the heart.   Medication Adjustments/Labs and Tests Ordered: Current medicines are reviewed at length with the patient today.  Concerns regarding medicines are outlined above.  Orders Placed This Encounter  Procedures  . CT CORONARY MORPH W/CTA COR W/SCORE W/CA W/CM &/OR WO/CM  . CT CORONARY FRACTIONAL FLOW RESERVE DATA PREP  . CT  CORONARY FRACTIONAL FLOW RESERVE FLUID ANALYSIS  . Basic metabolic panel  . EKG 12-Lead  . ECHOCARDIOGRAM COMPLETE  . Split night study   Meds ordered this encounter  Medications  . metoprolol tartrate (LOPRESSOR) 100 MG tablet    Sig: Take 1 tablet 2 hours prior to your Coronary CT    Dispense:  1 tablet    Refill:  0    Patient Instructions  Medication Instructions:  Your physician has recommended you make the following change in your medication:  Take 1 tablet of Metoprolol 100mg  2 hours prior to your CT. An Rx has been sent to your pharmacy.  If you need a refill on your cardiac medications before your next appointment, please call your pharmacy.   Lab work: Your physician recommends that you return for lab work [employer]er, [employer]ellery) a couple of days prior to your CT  If you have labs (blood work) drawn today and your tests are completely normal, you will receive your results only by: Marland Kitchen MyChart Message (if you have MyChart) OR . A paper copy in the mail If you have any lab test that is abnormal or we need to change your treatment, we will call you to review the results.  Testing/Procedures: Non-Cardiac CT Angiography (CTA), is a special type of CT scan that uses a computer to produce multi-dimensional views of major blood vessels throughout the body. In CT angiography, a contrast material is injected through an IV to help visualize the blood vessels  Your physician has requested that you have an echocardiogram.  Echocardiography is a painless test that uses sound waves to create images of your heart. It provides your doctor with information about the size and shape of your heart and how well your heart's chambers and valves are working. This procedure takes approximately one hour. There are no restrictions for this procedure.  Your physician has recommended that you have a sleep study. This test records several body functions during sleep, including: brain activity, eye movement, oxygen and carbon dioxide blood levels, heart rate and rhythm, breathing rate and rhythm, the flow of air through your mouth and nose, snoring, body muscle movements, and chest and belly movement.     Follow-Up: At [facility], you and your health needs are our priority.  As part of our continuing mission to provide you with exceptional heart care, we have created designated Provider Care Teams.  These Care Teams include your primary Cardiologist (physician) and Advanced Practice Providers (APPs -  Physician Assistants and Nurse Practitioners) who all work together to provide you with the care you need, when you need it. You will need a follow up appointment in 3 months. (Please schedule in a 10am of 10:40am slot)  Please call our office 2 months in advance to schedule this appointment.  You may see Dr.Gumaro Brightbill or one of the following Advanced Practice Providers on your designated Care Team:   Theodore Demark, PA-C . Joni Reining, DNP, ANP  Any Other Special Instructions Will Be Listed Below (If Applicable).   Please arrive at the [facility] main entrance of [facility] TBD AM (30-45 minutes prior to test start time)  Please come to our office a couple of days prior to your CT for labs.  [facility] [address] [place], [place] [address] [phone]  Proceed to the [facility] Radiology Department (First Floor).  Please follow these instructions carefully (unless otherwise  directed):  Hold all erectile dysfunction medications at least 48 hours  prior to test.  On the Night Before the Test: . Be sure to Drink plenty of water. . Do not consume any caffeinated/decaffeinated beverages or chocolate 12 hours prior to your test. . Do not take any antihistamines 12 hours prior to your test. I On the Day of the Test: . Drink plenty of water. Do not drink any water within one hour of the test. . Do not eat any food 4 hours prior to the test. . You may take your regular medications prior to the test.  . Take metoprolol (Lopressor) two hours prior to test.        After the Test: . Drink plenty of water. . After receiving IV contrast, you may experience a mild flushed feeling. This is normal. . On occasion, you may experience a mild rash up to 24 hours after the test. This is not dangerous. If this occurs, you can take Benadryl 25 mg and increase your fluid intake. . If you experience trouble breathing, this can be serious. If it is severe call 911 IMMEDIATELY. If it is mild, please call our office. . If you take any of these medications: Glipizide/Metformin, Avandament, Glucavance, please do not take 48 hours after completing test.        Signed, Parke Poisson, MD  06/16/2018 11:20 AM    [facility]

## 2018-06-17 ENCOUNTER — Telehealth: Payer: Self-pay | Admitting: *Deleted

## 2018-06-17 NOTE — Telephone Encounter (Signed)
Patient notified of sleep study appointment scheduled for 08/03/18.

## 2018-06-17 NOTE — Telephone Encounter (Signed)
-----   Message from Jarvis Newcomer, RN sent at 06/16/2018 11:22 AM EDT ----- Regarding: pt needs a sleep study Per Dr.Acharya this patient needs to be scheduled for a sleep study

## 2018-06-20 ENCOUNTER — Encounter (HOSPITAL_COMMUNITY): Payer: Self-pay

## 2018-06-20 ENCOUNTER — Emergency Department (HOSPITAL_COMMUNITY): Payer: Self-pay

## 2018-06-20 ENCOUNTER — Emergency Department (HOSPITAL_COMMUNITY)
Admission: EM | Admit: 2018-06-20 | Discharge: 2018-06-20 | Disposition: A | Discharge status: Home / Self Care | Payer: Self-pay | Attending: Emergency Medicine | Admitting: Emergency Medicine

## 2018-06-20 DIAGNOSIS — Z7982 Long term (current) use of aspirin: Secondary | ICD-10-CM | POA: Insufficient documentation

## 2018-06-20 DIAGNOSIS — G44319 Acute post-traumatic headache, not intractable: Secondary | ICD-10-CM | POA: Insufficient documentation

## 2018-06-20 DIAGNOSIS — I10 Essential (primary) hypertension: Secondary | ICD-10-CM | POA: Insufficient documentation

## 2018-06-20 DIAGNOSIS — Z79899 Other long term (current) drug therapy: Secondary | ICD-10-CM | POA: Insufficient documentation

## 2018-06-20 DIAGNOSIS — F1210 Cannabis abuse, uncomplicated: Secondary | ICD-10-CM | POA: Insufficient documentation

## 2018-06-20 DIAGNOSIS — S0990XA Unspecified injury of head, initial encounter: Secondary | ICD-10-CM | POA: Insufficient documentation

## 2018-06-20 DIAGNOSIS — Y9389 Activity, other specified: Secondary | ICD-10-CM | POA: Insufficient documentation

## 2018-06-20 DIAGNOSIS — Y929 Unspecified place or not applicable: Secondary | ICD-10-CM | POA: Insufficient documentation

## 2018-06-20 DIAGNOSIS — R42 Dizziness and giddiness: Secondary | ICD-10-CM | POA: Insufficient documentation

## 2018-06-20 DIAGNOSIS — Y998 Other external cause status: Secondary | ICD-10-CM | POA: Insufficient documentation

## 2018-06-20 DIAGNOSIS — F17290 Nicotine dependence, other tobacco product, uncomplicated: Secondary | ICD-10-CM | POA: Insufficient documentation

## 2018-06-20 MED ORDER — ACETAMINOPHEN 500 MG PO TABS
1000.0000 mg | ORAL_TABLET | Freq: Once | ORAL | Status: AC
  Administered 2018-06-20: 1000 mg via ORAL
  Filled 2018-06-20: qty 2
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
## 2018-06-25 ENCOUNTER — Other Ambulatory Visit (HOSPITAL_COMMUNITY): Payer: No Typology Code available for payment source

## 2018-06-30 ENCOUNTER — Other Ambulatory Visit: Payer: Self-pay

## 2018-06-30 ENCOUNTER — Ambulatory Visit (HOSPITAL_COMMUNITY): Payer: No Typology Code available for payment source | Attending: Cardiology

## 2018-06-30 DIAGNOSIS — R0789 Other chest pain: Secondary | ICD-10-CM | POA: Insufficient documentation

## 2018-07-05 ENCOUNTER — Telehealth: Payer: Self-pay

## 2018-07-05 NOTE — Telephone Encounter (Signed)
-----   Message from Parke Poisson, MD sent at 07/05/2018 10:57 AM EST ----- Echocardiogram is grossly normal. No evidence of severe pulmonary hypertension or severe LV dysfunction. Image quality is suboptimal so we were only able to get a general sense of his heart function. We will use the CT scan to help determine if his symptoms are coronary in origin. If there are any remaining questions after the CT scan, we can consider getting a repeat echo with IV Definity for wall motion.  (All future echos must be performed with IV Definity contrast).

## 2018-07-05 NOTE — Telephone Encounter (Signed)
Pt aware of echo results with verbal understanding. 

## 2018-07-14 ENCOUNTER — Ambulatory Visit (HOSPITAL_COMMUNITY): Payer: No Typology Code available for payment source

## 2018-07-14 ENCOUNTER — Ambulatory Visit (HOSPITAL_COMMUNITY)
Admission: RE | Admit: 2018-07-14 | Discharge: 2018-07-14 | Disposition: A | Discharge status: Home / Self Care | Payer: No Typology Code available for payment source | Source: Ambulatory Visit | Attending: Internal Medicine | Admitting: Internal Medicine

## 2018-07-14 DIAGNOSIS — R0789 Other chest pain: Secondary | ICD-10-CM

## 2018-07-14 MED ORDER — METOPROLOL TARTRATE 5 MG/5ML IV SOLN
INTRAVENOUS | Status: AC
  Filled 2018-07-14: qty 20

## 2018-07-14 MED ORDER — NITROGLYCERIN 0.4 MG SL SUBL
0.8000 mg | SUBLINGUAL_TABLET | Freq: Once | SUBLINGUAL | Status: AC
  Administered 2018-07-14: 0.8 mg via SUBLINGUAL
  Filled 2018-07-14: qty 25

## 2018-07-14 MED ORDER — NITROGLYCERIN 0.4 MG SL SUBL
SUBLINGUAL_TABLET | SUBLINGUAL | Status: AC
  Filled 2018-07-14: qty 2

## 2018-07-14 MED ORDER — IOPAMIDOL (ISOVUE-370) INJECTION 76%
100.0000 mL | Freq: Once | INTRAVENOUS | Status: AC | PRN
  Administered 2018-07-14: 100 mL via INTRAVENOUS

## 2018-07-14 MED ORDER — METOPROLOL TARTRATE 5 MG/5ML IV SOLN
5.0000 mg | INTRAVENOUS | Status: DC | PRN
  Administered 2018-07-14 (×2): 5 mg via INTRAVENOUS
  Filled 2018-07-14 (×3): qty 5

## 2018-07-14 NOTE — Progress Notes (Signed)
Ct complete. Patient denies any complaints.  

## 2018-07-15 ENCOUNTER — Telehealth: Payer: Self-pay

## 2018-07-15 NOTE — Telephone Encounter (Signed)
-----   Message from Parke PoissonGayatri A Acharya, MD sent at 07/14/2018  5:28 PM EST ----- Normal coronary arteries - no detectable blockages.

## 2018-07-15 NOTE — Telephone Encounter (Signed)
Pt aware of Cor CT results with verbal understanding. 

## 2018-07-15 NOTE — Telephone Encounter (Signed)
Called to give Cor CTA results. [email or web address].

## 2018-07-25 DIAGNOSIS — K4020 Bilateral inguinal hernia, without obstruction or gangrene, not specified as recurrent: Secondary | ICD-10-CM

## 2018-07-25 HISTORY — DX: Bilateral inguinal hernia, without obstruction or gangrene, not specified as recurrent: K40.20

## 2018-07-27 ENCOUNTER — Ambulatory Visit: Payer: Self-pay | Admitting: Internal Medicine

## 2018-07-27 ENCOUNTER — Telehealth: Payer: Self-pay | Admitting: Internal Medicine

## 2018-07-27 ENCOUNTER — Encounter: Payer: Self-pay | Admitting: Internal Medicine

## 2018-07-27 VITALS — BP 124/80 | HR 78 | Resp 12 | Ht 62.5 in | Wt 235.0 lb

## 2018-07-27 DIAGNOSIS — F319 Bipolar disorder, unspecified: Secondary | ICD-10-CM

## 2018-07-27 DIAGNOSIS — M542 Cervicalgia: Secondary | ICD-10-CM

## 2018-07-27 DIAGNOSIS — R42 Dizziness and giddiness: Secondary | ICD-10-CM

## 2018-07-27 DIAGNOSIS — I10 Essential (primary) hypertension: Secondary | ICD-10-CM

## 2018-07-27 DIAGNOSIS — G44209 Tension-type headache, unspecified, not intractable: Secondary | ICD-10-CM

## 2018-07-27 NOTE — Telephone Encounter (Signed)
Patient called to inform Dr. Delrae AlfredMulberry of Rx name.  He is currently taking Prazosin HCL 1 mg. Capsule at bedtime.

## 2018-07-27 NOTE — Progress Notes (Signed)
   Subjective:   Patient ID: Samuel SimpersLance Alexander Broman, male    DOB: August 13, 1981, 37 y.o.   MRN: [id number]  HPI   1.  Hypertension:  States he did not stop his Lisinopril/HCTZ,  just ran out a couple of days ago.  He feels his dizzy episodes have worsened since starting the HCTZ with Lisinopril.  2.  Dizzy episodes:  Feels like he has done everything he can to resolve the dizziness. Again, describes a sense of spinning and feeling unbalanced.   Has treated sinusitis, allergies, cardiac evaluation with normal coronary arteries on CT and has had a normal CT of brain He has not, however, taken his Citalopram or Risperdal regularly.  Also still with the pressure at times on top of head, like someone pressing down on top of head and when stands notes this more.  3.  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 Previously, stated he was not evaluated for this in past.  Today, states he was diagnosed as having Tourette's Syndrome.  He stiffens up his entire body to prevent the tics from occurring.  Medication in the past did not really help.  He states he does have associated OCD.  Continues with repetitive behaviors he has to perform.    4.  Psych:  States bipolar disorder/anxiety/depression/PTSD.  Followed by Dr.  Cherrie GauzeAlemou he thinks.  His counselor is [facility].   He does not take his Citalopram and Risperdal regularly.  He has been told by me and Annia Friendlyhester to take regularly, but has not yet started that. Sounds like he is taking something such as Prasozin for nightmares.  Current Meds  Medication Sig  . lisinopril-hydrochlorothiazide (PRINZIDE,ZESTORETIC) 20-25 MG tablet Take 1 tablet by mouth daily.    Allergies  Allergen Reactions  . Motrin [Ibuprofen] Hives  . Lactose Intolerance (Gi) Diarrhea    Review of Systems    Objective:  Physical Exam Tender over traps bilaterally and tight.  Poor posture Lungs:  CTA CV:  RRR without murmur or rub.  Radial  pulses normal and equal Neuro:  A & O x 3, CN  II-XII grossly intact, DTRs 2+/4 and Motor 5/5 throughout.  Normal alternating movements, normal finger to nose to finger.  Romberg negative. No nystagmus. Does have gyrating movements of head, neck at times.      Assessment & Plan:  1.  Hypertension:  To not run out of meds.  Would like for him to continue to take Lisinopril/HCTZ  2.  Dizzy episodes:  May be due to starting and stopping Citalopram and Risperdal, doses unknown.   To take these regularly as prescribed.  He has had extensive work up to evaluate. Neck and upper back muscular tightness and tenderness could also be an element of dizziness.  3. Tight and tender traps and neck:  Referral to [facility].  4.  Tic Disorder/possible Tourette's Syndrome.  Patient should be taking Risperdal regularly, which can help with the movement disorder portion of this.  Encouraged him to take regularly also for this. Dose unknown currently.  5.  Bipolar Disorder/PTSD:  As per psych.  As in concerns above, to take his meds regularly.

## 2018-07-27 NOTE — Telephone Encounter (Signed)
To Dr. Mulberry FYI 

## 2018-08-03 ENCOUNTER — Emergency Department (HOSPITAL_COMMUNITY)
Admission: EM | Admit: 2018-08-03 | Discharge: 2018-08-03 | Disposition: A | Discharge status: Home / Self Care | Payer: No Typology Code available for payment source | Attending: Emergency Medicine | Admitting: Emergency Medicine

## 2018-08-03 ENCOUNTER — Encounter (HOSPITAL_COMMUNITY): Payer: Self-pay | Admitting: Emergency Medicine

## 2018-08-03 ENCOUNTER — Other Ambulatory Visit: Payer: Self-pay

## 2018-08-03 ENCOUNTER — Ambulatory Visit (HOSPITAL_BASED_OUTPATIENT_CLINIC_OR_DEPARTMENT_OTHER): Payer: No Typology Code available for payment source | Attending: Internal Medicine | Admitting: Cardiovascular Disease

## 2018-08-03 VITALS — Ht 64.0 in | Wt 237.0 lb

## 2018-08-03 DIAGNOSIS — I10 Essential (primary) hypertension: Secondary | ICD-10-CM | POA: Insufficient documentation

## 2018-08-03 DIAGNOSIS — E669 Obesity, unspecified: Secondary | ICD-10-CM | POA: Insufficient documentation

## 2018-08-03 DIAGNOSIS — Z7982 Long term (current) use of aspirin: Secondary | ICD-10-CM | POA: Insufficient documentation

## 2018-08-03 DIAGNOSIS — Z79899 Other long term (current) drug therapy: Secondary | ICD-10-CM | POA: Insufficient documentation

## 2018-08-03 DIAGNOSIS — R51 Headache: Secondary | ICD-10-CM | POA: Insufficient documentation

## 2018-08-03 DIAGNOSIS — R0602 Shortness of breath: Secondary | ICD-10-CM

## 2018-08-03 DIAGNOSIS — F17290 Nicotine dependence, other tobacco product, uncomplicated: Secondary | ICD-10-CM | POA: Insufficient documentation

## 2018-08-03 DIAGNOSIS — Z6841 Body Mass Index (BMI) 40.0 and over, adult: Secondary | ICD-10-CM | POA: Insufficient documentation

## 2018-08-03 DIAGNOSIS — R0683 Snoring: Secondary | ICD-10-CM

## 2018-08-03 DIAGNOSIS — R0902 Hypoxemia: Secondary | ICD-10-CM | POA: Insufficient documentation

## 2018-08-03 DIAGNOSIS — G4733 Obstructive sleep apnea (adult) (pediatric): Secondary | ICD-10-CM | POA: Insufficient documentation

## 2018-08-03 DIAGNOSIS — R5383 Other fatigue: Secondary | ICD-10-CM | POA: Insufficient documentation

## 2018-08-03 LAB — CBC
HEMATOCRIT: 52.6 % — AB (ref 39.0–52.0)
HEMOGLOBIN: 16.8 g/dL (ref 13.0–17.0)
MCH: 28.7 pg (ref 26.0–34.0)
MCHC: 31.9 g/dL (ref 30.0–36.0)
MCV: 89.8 fL (ref 80.0–100.0)
NRBC: 0 % (ref 0.0–0.2)
PLATELETS: 393 10*3/uL (ref 150–400)
RBC: 5.86 MIL/uL — AB (ref 4.22–5.81)
RDW: 11.9 % (ref 11.5–15.5)
WBC: 7.9 10*3/uL (ref 4.0–10.5)

## 2018-08-03 LAB — BASIC METABOLIC PANEL
ANION GAP: 12 (ref 5–15)
BUN: 13 mg/dL (ref 6–20)
CHLORIDE: 103 mmol/L (ref 98–111)
CO2: 20 mmol/L — AB (ref 22–32)
Calcium: 9.8 mg/dL (ref 8.9–10.3)
Creatinine, Ser: 1.27 mg/dL — ABNORMAL HIGH (ref 0.61–1.24)
GFR calc non Af Amer: >60 mL/min (ref >60)
Glucose, Bld: 112 mg/dL — ABNORMAL HIGH (ref 70–99)
POTASSIUM: 4.7 mmol/L (ref 3.5–5.1)
Sodium: 135 mmol/L (ref 135–145)

## 2018-08-03 MED ORDER — HYDROXYZINE HCL 25 MG PO TABS
25.0000 mg | ORAL_TABLET | Freq: Once | ORAL | Status: AC
  Administered 2018-08-03: 25 mg via ORAL
  Filled 2018-08-03: qty 1

## 2018-08-03 MED ORDER — HYDROXYZINE HCL 25 MG PO TABS: 25.0000 mg | ORAL_TABLET | Freq: Three times a day (TID) | ORAL | 0 refills | Status: DC | PRN

## 2018-08-03 MED ORDER — RISPERIDONE 0.5 MG PO TABS
0.5000 mg | ORAL_TABLET | Freq: Once | ORAL | Status: AC
  Administered 2018-08-03: 0.5 mg via ORAL
  Filled 2018-08-03: qty 1

## 2018-08-03 NOTE — ED Triage Notes (Signed)
Onset last night developed shortness of breath felt better then symptoms returned with lightheadedness, dizziness tingling bilateral hands and feet with hyperventilating. Has at CT of chest last week test negative per patient. Took his blood pressure and aspirin 650 mg PO prior to arrival. Did not take his anti anxiety medication.

## 2018-08-03 NOTE — ED Provider Notes (Signed)
[facility] EMERGENCY DEPARTMENT Provider Note   CSN: [id number] Arrival date & time: 08/03/18  1304     History   Chief Complaint Chief Complaint  Patient presents with  . Shortness of Breath  . Numbness  . Dizziness    HPI Samuel Austin is a 37 y.o. male.  Patient with history of depression, hypertension who presents to the ED with various symptoms.  Patient states intermittent shortness of breath, paresthesias, hyperventilation, chest pain.  Patient states that he has had big work-up with primary care doctor and multiple ED visits without any answers.  Patient states that he does follow with a psychologist for these symptoms and has been trying some cognitive behavioral therapy.  He takes Risperdal but has not taken it today.  Patient overall asymptomatic upon my evaluation.  States symptoms have been worse over the last 2 days.  He denies any stressors.  No homicidal or suicidal ideation.  The history is provided by the patient.  Illness  This is a recurrent problem. The current episode started more than 1 week ago. The problem occurs every several days. The problem has been resolved. Associated symptoms include shortness of breath. Pertinent negatives include no chest pain, no abdominal pain and no headaches. Nothing aggravates the symptoms. Nothing relieves the symptoms. He has tried nothing for the symptoms. The treatment provided no relief.    Past Medical History:  Diagnosis Date  . Depression   . Hyperchloremia   . Hypertension     Patient Active Problem List   Diagnosis Date Noted  . Dizziness 04/13/2018  . Hypertension 03/11/2018  . Hyperlipidemia 03/11/2018  . Episodic mood disorder (HCC) 03/11/2018    History reviewed. No pertinent surgical history.      Home Medications    Prior to Admission medications   Medication Sig Start Date End Date Taking? Authorizing Provider  aspirin EC 81 MG tablet Take 1 tablet (81 mg total)  by mouth daily. Patient not taking: Reported on 07/27/2018 05/27/18   Julieanne MansonMulberry, Elizabeth, MD  citalopram (CELEXA) 10 MG tablet Take 10 mg by mouth daily.    [provider]  famotidine (PEPCID) 20 MG tablet 2 tabs by mouth at bedtime Patient not taking: Reported on 07/14/2018 05/27/18   Julieanne MansonMulberry, Elizabeth, MD  fluticasone [facility](FLONASE) 50 MCG/ACT nasal spray Place 2 sprays into both nostrils daily. Patient not taking: Reported on 07/14/2018 04/01/18   Julieanne MansonMulberry, Elizabeth, MD  hydrOXYzine (ATARAX/VISTARIL) 25 MG tablet Take 1 tablet (25 mg total) by mouth every 8 (eight) hours as needed for up to 20 doses for anxiety. 08/03/18   Briceyda Abdullah, DO  lisinopril-hydrochlorothiazide (PRINZIDE,ZESTORETIC) 20-25 MG tablet Take 1 tablet by mouth daily. 04/13/18   Julieanne MansonMulberry, Elizabeth, MD  risperiDONE (RISPERDAL) 0.5 MG tablet Take 0.5 mg by mouth 2 (two) times daily.    [provider]  UNKNOWN TO PATIENT Take 1 tablet by mouth See admin instructions. Med unknown to patient for mood/nightmares: Take 1 tablet by mouth at bedtime as needed for nightmares or mood    [provider]    Family History No family history on file.  Social History Social History   Tobacco Use  . Smoking status: Current Some Day Smoker    Types: Cigars  . Smokeless tobacco: Never Used  Substance Use Topics  . Alcohol use: Yes    Comment: occ  . Drug use: Not Currently    Types: Marijuana     Allergies   Motrin [ibuprofen]  and Lactose intolerance (gi)   Review of Systems Review of Systems  Constitutional: Negative for chills and fever.  HENT: Negative for ear pain and sore throat.   Eyes: Negative for pain and visual disturbance.  Respiratory: Positive for shortness of breath. Negative for cough.   Cardiovascular: Negative for chest pain and palpitations.  Gastrointestinal: Negative for abdominal pain and vomiting.  Genitourinary: Negative for dysuria and hematuria.  Musculoskeletal:  Negative for arthralgias and back pain.  Skin: Negative for color change and rash.  Neurological: Positive for dizziness and numbness. Negative for tremors, seizures, syncope, facial asymmetry, speech difficulty, weakness, light-headedness and headaches.  Psychiatric/Behavioral: Negative for agitation, behavioral problems, confusion and suicidal ideas. The patient is nervous/anxious.   All other systems reviewed and are negative.    Physical Exam Updated Vital Signs  ED Triage Vitals  Enc Vitals Group     BP 08/03/18 1309 119/70     Pulse Rate 08/03/18 1309 90     Resp 08/03/18 1309 (!) 22     Temp 08/03/18 1309 98.9 F (37.2 C)     Temp Source 08/03/18 1309 Oral     SpO2 08/03/18 1309 100 %     Weight 08/03/18 1318 235 lb (106.6 kg)     Height 08/03/18 1318 5\' 4"  (1.626 m)     Head Circumference --      Peak Flow --      Pain Score 08/03/18 1318 0     Pain Loc --      Pain Edu? --      Excl. in GC? --     Physical Exam  Constitutional: He appears well-developed and well-nourished.  HENT:  Head: Normocephalic and atraumatic.  Eyes: Pupils are equal, round, and reactive to light. Conjunctivae and EOM are normal.  Neck: Normal range of motion. Neck supple.  Cardiovascular: Normal rate and regular rhythm.  No murmur heard. Pulmonary/Chest: Effort normal and breath sounds normal. No respiratory distress. He has no decreased breath sounds. He has no wheezes. He has no rhonchi. He has no rales.  Abdominal: Soft. There is no tenderness.  Musculoskeletal: He exhibits no edema.       Right lower leg: He exhibits no edema.       Left lower leg: He exhibits no edema.  Neurological: He is alert.  5+ out of 5 strength throughout, normal sensation, no drift, normal gait, normal finger-to-nose finger    Skin: Skin is warm and dry. Capillary refill takes less than 2 seconds.  Psychiatric: He has a normal mood and affect.  Nursing note and vitals reviewed.    ED Treatments /  Results  Labs (all labs ordered are listed, but only abnormal results are displayed) Labs Reviewed  BASIC METABOLIC PANEL - Abnormal; Notable for the following components:      Result Value   CO2 20 (*)    Glucose, Bld 112 (*)    Creatinine, Ser 1.27 (*)    All other components within normal limits  CBC - Abnormal; Notable for the following components:   RBC 5.86 (*)    HCT 52.6 (*)    All other components within normal limits    EKG EKG Interpretation  Date/Time:  Tuesday August 03 2018 13:16:15 EST Ventricular Rate:  93 PR Interval:  164 QRS Duration: 90 QT Interval:  356 QTC Calculation: 442 R Axis:   59 Text Interpretation:  Normal sinus rhythm Normal ECG Confirmed by Virgina Norfolk [phone]) on 08/03/2018 3:15:32 PM  Radiology No results found.  Procedures Procedures (including critical care time)  Medications Ordered in ED Medications  risperiDONE (RISPERDAL) tablet 0.5 mg (has no administration in time range)  hydrOXYzine (ATARAX/VISTARIL) tablet 25 mg (has no administration in time range)     Initial Impression / Assessment and Plan / ED Course  I have reviewed the triage vital signs and the nursing notes.  Pertinent labs & imaging results that were available during my care of the patient were reviewed by me and considered in my medical decision making (see chart for details).     Samuel Austin is a 37 year old male with history of hypertension, depression who presents to the ED with multiple complaints.  Patient with normal vitals.  No fever.  Patient after further discussion appears to have likely some type of anxiety disorder.  Patient states that he gets intermittent numbness and tingling throughout his body, shortness of breath, hyperventilation all of a sudden without any normal triggers.  He is asymptomatic upon my evaluation.  No chest pain, no shortness of breath.  He states that a random times he will be, overwhelmed with these feelings.   He does take respite all for anxiety.  He does follow-up with psychologist.  His primary care doctor also prescribes antidepressant.  EKG here shows sinus rhythm.  No concern for cardiac process. No chest pain. No PE risk factors and PERC negative. Basic labs were overall unremarkable.  History and physical is not consistent with a stroke.  He has normal neurological exam.  After further discussion and evaluation believe patient likely with anxiety disorder.  Will prescribe Vistaril as needed.  Given his home dose of Rispedal as he has not taken yet today.  Recommend follow-up with primary care doctor and discharged from ED in good condition.  Given return precautions.  This chart was dictated using voice recognition software.  Despite best efforts to proofread,  errors can occur which can change the documentation meaning.   Final Clinical Impressions(s) / ED Diagnoses   Final diagnoses:  SOB (shortness of breath)    ED Discharge Orders         Ordered    hydrOXYzine (ATARAX/VISTARIL) 25 MG tablet  Every 8 hours PRN     08/03/18 1528           Virgina Norfolk, DO 08/03/18 1533

## 2018-08-03 NOTE — ED Notes (Signed)
Got patient undress on the monitor patient is resting with call bell in reach 

## 2018-08-03 NOTE — ED Notes (Signed)
Patient verbalizes understanding of discharge instructions. Opportunity for questioning and answers were provided. 

## 2018-08-07 ENCOUNTER — Emergency Department (HOSPITAL_COMMUNITY): Payer: No Typology Code available for payment source

## 2018-08-07 ENCOUNTER — Other Ambulatory Visit: Payer: Self-pay

## 2018-08-07 ENCOUNTER — Emergency Department (HOSPITAL_COMMUNITY)
Admission: EM | Admit: 2018-08-07 | Discharge: 2018-08-07 | Disposition: A | Discharge status: Home / Self Care | Payer: No Typology Code available for payment source | Attending: Emergency Medicine | Admitting: Emergency Medicine

## 2018-08-07 ENCOUNTER — Encounter (HOSPITAL_COMMUNITY): Payer: Self-pay | Admitting: *Deleted

## 2018-08-07 DIAGNOSIS — Z7982 Long term (current) use of aspirin: Secondary | ICD-10-CM | POA: Insufficient documentation

## 2018-08-07 DIAGNOSIS — Z79899 Other long term (current) drug therapy: Secondary | ICD-10-CM | POA: Insufficient documentation

## 2018-08-07 DIAGNOSIS — F17210 Nicotine dependence, cigarettes, uncomplicated: Secondary | ICD-10-CM | POA: Insufficient documentation

## 2018-08-07 DIAGNOSIS — R079 Chest pain, unspecified: Secondary | ICD-10-CM

## 2018-08-07 DIAGNOSIS — R42 Dizziness and giddiness: Secondary | ICD-10-CM | POA: Insufficient documentation

## 2018-08-07 DIAGNOSIS — I10 Essential (primary) hypertension: Secondary | ICD-10-CM | POA: Insufficient documentation

## 2018-08-07 LAB — COMPREHENSIVE METABOLIC PANEL
ALT: 30 U/L (ref 0–44)
AST: 60 U/L — AB (ref 15–41)
Albumin: 4.4 g/dL (ref 3.5–5.0)
Alkaline Phosphatase: 50 U/L (ref 38–126)
Anion gap: 16 — ABNORMAL HIGH (ref 5–15)
BUN: 9 mg/dL (ref 6–20)
CO2: 24 mmol/L (ref 22–32)
CREATININE: 1.36 mg/dL — AB (ref 0.61–1.24)
Calcium: 9.9 mg/dL (ref 8.9–10.3)
Chloride: 98 mmol/L (ref 98–111)
GFR calc Af Amer: >60 mL/min (ref >60)
GFR calc non Af Amer: >60 mL/min (ref >60)
Glucose, Bld: 126 mg/dL — ABNORMAL HIGH (ref 70–99)
POTASSIUM: 3.6 mmol/L (ref 3.5–5.1)
Sodium: 138 mmol/L (ref 135–145)
Total Bilirubin: 1.6 mg/dL — ABNORMAL HIGH (ref 0.3–1.2)
Total Protein: 7.8 g/dL (ref 6.5–8.1)

## 2018-08-07 LAB — CBC WITH DIFFERENTIAL/PLATELET
Abs Immature Granulocytes: 0.01 10*3/uL (ref 0.00–0.07)
BASOS PCT: 1 %
Basophils Absolute: 0 10*3/uL (ref 0.0–0.1)
Eosinophils Absolute: 0.1 10*3/uL (ref 0.0–0.5)
Eosinophils Relative: 2 %
HCT: 51.7 % (ref 39.0–52.0)
Hemoglobin: 16.8 g/dL (ref 13.0–17.0)
Immature Granulocytes: 0 %
LYMPHS ABS: 3.7 10*3/uL (ref 0.7–4.0)
Lymphocytes Relative: 48 %
MCH: 29 pg (ref 26.0–34.0)
MCHC: 32.5 g/dL (ref 30.0–36.0)
MCV: 89.3 fL (ref 80.0–100.0)
MONOS PCT: 12 %
Monocytes Absolute: 0.9 10*3/uL (ref 0.1–1.0)
Neutro Abs: 2.7 10*3/uL (ref 1.7–7.7)
Neutrophils Relative %: 37 %
Platelets: 362 10*3/uL (ref 150–400)
RBC: 5.79 MIL/uL (ref 4.22–5.81)
RDW: 11.9 % (ref 11.5–15.5)
WBC: 7.4 10*3/uL (ref 4.0–10.5)
nRBC: 0 % (ref 0.0–0.2)

## 2018-08-07 LAB — I-STAT CHEM 8, ED
BUN: 10 mg/dL (ref 6–20)
CHLORIDE: 102 mmol/L (ref 98–111)
Calcium, Ion: 1.07 mmol/L — ABNORMAL LOW (ref 1.15–1.40)
Creatinine, Ser: 1.2 mg/dL (ref 0.61–1.24)
GLUCOSE: 122 mg/dL — AB (ref 70–99)
HCT: 54 % — ABNORMAL HIGH (ref 39.0–52.0)
Hemoglobin: 18.4 g/dL — ABNORMAL HIGH (ref 13.0–17.0)
Potassium: 3.1 mmol/L — ABNORMAL LOW (ref 3.5–5.1)
Sodium: 138 mmol/L (ref 135–145)
TCO2: 23 mmol/L (ref 22–32)

## 2018-08-07 LAB — RAPID URINE DRUG SCREEN, HOSP PERFORMED
Amphetamines: NOT DETECTED
BENZODIAZEPINES: NOT DETECTED
Barbiturates: NOT DETECTED
Cocaine: NOT DETECTED
Opiates: NOT DETECTED
Tetrahydrocannabinol: NOT DETECTED

## 2018-08-07 LAB — LIPASE, BLOOD: Lipase: 25 U/L (ref 11–51)

## 2018-08-07 LAB — I-STAT TROPONIN, ED: Troponin i, poc: 0.01 ng/mL (ref 0.00–0.08)

## 2018-08-07 MED ORDER — LORAZEPAM 2 MG/ML IJ SOLN
1.0000 mg | Freq: Once | INTRAMUSCULAR | Status: AC
  Administered 2018-08-07: 1 mg via INTRAVENOUS
  Filled 2018-08-07: qty 1

## 2018-08-07 MED ORDER — PROMETHAZINE HCL 25 MG/ML IJ SOLN
25.0000 mg | Freq: Once | INTRAMUSCULAR | Status: AC
  Administered 2018-08-07: 25 mg via INTRAVENOUS
  Filled 2018-08-07: qty 1

## 2018-08-07 MED ORDER — SODIUM CHLORIDE 0.9 % IV BOLUS
1000.0000 mL | Freq: Once | INTRAVENOUS | Status: AC
  Administered 2018-08-07: 1000 mL via INTRAVENOUS

## 2018-08-07 MED ORDER — IOPAMIDOL (ISOVUE-370) INJECTION 76%
100.0000 mL | Freq: Once | INTRAVENOUS | Status: AC | PRN
  Administered 2018-08-07: 100 mL via INTRAVENOUS

## 2018-08-07 NOTE — Discharge Instructions (Addendum)
If you were given medicines take as directed.  If you are on coumadin or contraceptives realize their levels and effectiveness is altered by many different medicines.  If you have any reaction (rash, tongues swelling, other) to the medicines stop taking and see a physician.    If your blood pressure was elevated in the ER make sure you follow up for management with a primary doctor or return for chest pain, shortness of breath or stroke symptoms.  Please follow up as directed and return to the ER or see a physician for new or worsening symptoms.  Thank you. Vitals:   08/07/18 1405 08/07/18 1415 08/07/18 1558 08/07/18 1600  BP:  139/86 110/74 (!) 112/58  Pulse:  (!) 119  86  Resp:  (!) 38 19 (!) 26  Temp:      SpO2:  100% 100% 100%  Weight: 106.6 kg     Height: 5\' 4"  (1.626 m)

## 2018-08-07 NOTE — ED Provider Notes (Signed)
[facility] EMERGENCY DEPARTMENT Provider Note   CSN: [id number] Arrival date & time: 08/07/18  1354     History   Chief Complaint Chief Complaint  Patient presents with  . Shortness of Breath  . Chest Pain  . Tachycardia    HPI Samuel Austin is a 37 y.o. male hx of HTN, HL, presenting with chest pain, shortness of breath, dizziness.  Patient woke up this morning and had sudden onset of dizziness as well as chest pain abdominal pain.  Also has bilateral arm numbness as well.  Patient apparently had similar symptoms 2 weeks ago and was in the ED about a week ago for similar symptoms.  She has seen primary care doctor multiple times and was thought to have some anxiety.  Patient also had a CT head that was normal 2 months ago as well as CT coronary by cardiology that was unremarkable.  Patient has no known CAD.  Patient states that this is more severe than usual.  He did admit to some cocaine use previously.  The history is provided by the patient.    Past Medical History:  Diagnosis Date  . Depression   . Hyperchloremia   . Hypertension     Patient Active Problem List   Diagnosis Date Noted  . Dizziness 04/13/2018  . Hypertension 03/11/2018  . Hyperlipidemia 03/11/2018  . Episodic mood disorder (HCC) 03/11/2018    History reviewed. No pertinent surgical history.      Home Medications    Prior to Admission medications   Medication Sig Start Date End Date Taking? Authorizing Provider  aspirin EC 81 MG tablet Take 1 tablet (81 mg total) by mouth daily. Patient not taking: Reported on 07/27/2018 05/27/18   Julieanne Manson, MD  citalopram (CELEXA) 10 MG tablet Take 10 mg by mouth daily.    [provider]  famotidine (PEPCID) 20 MG tablet 2 tabs by mouth at bedtime Patient not taking: Reported on 07/14/2018 05/27/18   Julieanne Manson, MD  fluticasone [facility]) 50 MCG/ACT nasal spray Place 2 sprays into both nostrils  daily. Patient not taking: Reported on 07/14/2018 04/01/18   Julieanne Manson, MD  hydrOXYzine (ATARAX/VISTARIL) 25 MG tablet Take 1 tablet (25 mg total) by mouth every 8 (eight) hours as needed for up to 20 doses for anxiety. 08/03/18   Curatolo, Adam, DO  lisinopril-hydrochlorothiazide (PRINZIDE,ZESTORETIC) 20-25 MG tablet Take 1 tablet by mouth daily. 04/13/18   Julieanne Manson, MD  risperiDONE (RISPERDAL) 0.5 MG tablet Take 0.5 mg by mouth 2 (two) times daily.    [provider]  UNKNOWN TO PATIENT Take 1 tablet by mouth See admin instructions. Med unknown to patient for mood/nightmares: Take 1 tablet by mouth at bedtime as needed for nightmares or mood    [provider]    Family History No family history on file.  Social History Social History   Tobacco Use  . Smoking status: Current Some Day Smoker    Types: Cigars  . Smokeless tobacco: Never Used  Substance Use Topics  . Alcohol use: Yes    Comment: occ  . Drug use: Not Currently    Types: Marijuana     Allergies   Motrin [ibuprofen] and Lactose intolerance (gi)   Review of Systems Review of Systems  Respiratory: Positive for shortness of breath.   Cardiovascular: Positive for chest pain.  Neurological: Positive for dizziness.  All other systems reviewed and are negative.    Physical Exam Updated Vital Signs  BP 138/85   Pulse (!) 115   Temp 97.7 F (36.5 C)   Resp (!) 21   Ht 5\' 4"  (1.626 m)   Wt 106.6 kg   SpO2 100%   BMI 40.34 kg/m   Physical Exam Vitals signs and nursing note reviewed.  Constitutional:      Appearance: He is well-developed.     Comments: Anxious, diaphoretic   HENT:     Head: Normocephalic.     Mouth/Throat:     Mouth: Mucous membranes are moist.  Eyes:     Extraocular Movements: Extraocular movements intact.     Pupils: Pupils are equal, round, and reactive to light.     Comments: No obvious nystagmus   Neck:     Musculoskeletal: Normal range of  motion.  Cardiovascular:     Rate and Rhythm: Regular rhythm.     Comments: Tachycardic  Pulmonary:     Effort: Pulmonary effort is normal.     Breath sounds: Normal breath sounds.  Abdominal:     Palpations: Abdomen is soft.     Comments: Mild epigastric tenderness, no obvious focal RUQ tenderness   Musculoskeletal: Normal range of motion.     Comments: Good peripheral pulses   Skin:    General: Skin is warm.     Capillary Refill: Capillary refill takes less than 2 seconds.  Neurological:     General: No focal deficit present.     Mental Status: He is alert.     Comments: Nl strength and sensation throughout. CN 2- 12 intact   Psychiatric:        Mood and Affect: Mood normal.      ED Treatments / Results  Labs (all labs ordered are listed, but only abnormal results are displayed) Labs Reviewed  CBC WITH DIFFERENTIAL/PLATELET  COMPREHENSIVE METABOLIC PANEL  LIPASE, BLOOD  RAPID URINE DRUG SCREEN, HOSP PERFORMED  I-STAT CHEM 8, ED  I-STAT TROPONIN, ED    EKG EKG Interpretation  Date/Time:  Saturday August 07 2018 14:10:56 EST Ventricular Rate:  126 PR Interval:    QRS Duration: 91 QT Interval:  335 QTC Calculation: 485 R Axis:   80 Text Interpretation:  Sinus tachycardia Probable anteroseptal infarct, old Borderline repolarization abnormality tachycardia new since previous  Confirmed by Richardean CanalYao, David H [phone]([id number]) on 08/07/2018 2:31:47 PM   Radiology No results found.  Procedures Procedures (including critical care time)  Medications Ordered in ED Medications  sodium chloride 0.9 % bolus 1,000 mL (1,000 mLs Intravenous New Bag/Given 08/07/18 1428)  promethazine (PHENERGAN) injection 25 mg (has no administration in time range)  LORazepam (ATIVAN) injection 1 mg (1 mg Intravenous Given 08/07/18 1427)     Initial Impression / Assessment and Plan / ED Course  I have reviewed the triage vital signs and the nursing notes.  Pertinent labs & imaging results that  were available during my care of the patient were reviewed by me and considered in my medical decision making (see chart for details).    Samuel Austin is a 37 y.o. male here with chest pain, dizziness. Patient tachycardic in the ED. BP 130 R arm and 90 in L arm. Also very anxious and hx of anxiety. Consider dissection vs complex migraines vs panic attacks. Will get labs, CT head, CT dissection. Will give migraine cocktail.   4:15 PM Labs unremarkable. CXR showed no obvious widened mediastinum. CTA ordered. Signed out to Dr. Jodi MourningZavitz in the ED to follow up CT    Final  Clinical Impressions(s) / ED Diagnoses   Final diagnoses:  None    ED Discharge Orders    None       Charlynne Pander, MD 08/07/18 1615

## 2018-08-07 NOTE — ED Triage Notes (Signed)
Pt brought to ED by cousin due to SOB, chest pain, dizziness [phone]

## 2018-08-11 ENCOUNTER — Telehealth: Payer: Self-pay | Admitting: Internal Medicine

## 2018-08-11 NOTE — Telephone Encounter (Signed)
Patient called requesting information on the sleep study patient has done a few days ago. Cherice please advise.

## 2018-08-11 NOTE — Telephone Encounter (Signed)
To Dr. Mulberry for further direction 

## 2018-08-13 ENCOUNTER — Encounter (HOSPITAL_COMMUNITY): Payer: Self-pay | Admitting: Emergency Medicine

## 2018-08-13 ENCOUNTER — Emergency Department (HOSPITAL_COMMUNITY)
Admission: EM | Admit: 2018-08-13 | Discharge: 2018-08-13 | Discharge status: Against Medical Advice | Payer: No Typology Code available for payment source | Attending: Emergency Medicine | Admitting: Emergency Medicine

## 2018-08-13 ENCOUNTER — Other Ambulatory Visit: Payer: Self-pay

## 2018-08-13 DIAGNOSIS — Z5321 Procedure and treatment not carried out due to patient leaving prior to being seen by health care provider: Secondary | ICD-10-CM | POA: Insufficient documentation

## 2018-08-13 NOTE — ED Triage Notes (Signed)
Pt reports nausea and dizziness that started earlier today with a headache. Pt denies V/D. Pt took aspirin with some relief.

## 2018-08-13 NOTE — ED Notes (Signed)
No answer when called in waiting room. 

## 2018-08-16 ENCOUNTER — Telehealth: Payer: Self-pay | Admitting: Internal Medicine

## 2018-08-16 MED ORDER — HYDROXYZINE HCL 25 MG PO TABS: 25.0000 mg | ORAL_TABLET | Freq: Three times a day (TID) | ORAL | 1 refills | Status: DC | PRN

## 2018-08-16 NOTE — Telephone Encounter (Signed)
Called and wanted refills of hydroxyzine until [place] [facility].  He will notify them the med helps.  Received in ED on 12.10

## 2018-08-17 ENCOUNTER — Encounter (HOSPITAL_BASED_OUTPATIENT_CLINIC_OR_DEPARTMENT_OTHER): Payer: Self-pay | Admitting: Cardiovascular Disease

## 2018-08-17 NOTE — Procedures (Signed)
Patient Name: Samuel Austin, Rayshard Study Date: 08/03/2018 Gender: Male D.O.B: [birth year] Age (years): 37 Referring Provider: Weston BrassGayatri Acharya MD Height (inches): 64 Interpreting Physician: Nicki Guadalajarahomas Kelly MD, ABSM Weight (lbs): 237 RPSGT: Kriste BasqueGibbons, Christy BMI: 41 MRN: [id number] Neck Size: 19.00  CLINICAL INFORMATION Sleep Study Type: NPSG  Indication for sleep study: Fatigue, Hypertension, Morning Headaches, Obesity, OSA, Snoring, Witnessed Apneas  Epworth Sleepiness Score: 12  SLEEP STUDY TECHNIQUE As per the AASM Manual for the Scoring of Sleep and Associated Events v2.3 (April 2016) with a hypopnea requiring 4% desaturations.  The channels recorded and monitored were frontal, central and occipital EEG, electrooculogram (EOG), submentalis EMG (chin), nasal and oral airflow, thoracic and abdominal wall motion, anterior tibialis EMG, snore microphone, electrocardiogram, and pulse oximetry.  MEDICATIONS     aspirin EC 81 MG tablet         citalopram (CELEXA) 10 MG tablet         famotidine (PEPCID) 20 MG tablet         fluticasone (FLONASE) 50 MCG/ACT nasal spray         hydrOXYzine (ATARAX/VISTARIL) 25 MG tablet         lisinopril-hydrochlorothiazide (PRINZIDE,ZESTORETIC) 20-25 MG tablet         prazosin (MINIPRESS) 5 MG capsule         risperiDONE (RISPERDAL) 0.5 MG tablet      Medications self-administered by patient taken the night of the study : N/A  SLEEP ARCHITECTURE The study was initiated at 10:19:04 PM and ended at 4:54:37 AM.  Sleep onset time was 86.5 minutes and the sleep efficiency was 26.5%%. The total sleep time was 105 minutes.  Stage REM latency was N/A minutes.  The patient spent 21.4%% of the night in stage N1 sleep, 78.6%% in stage N2 sleep, 0.0%% in stage N3 and 0% in REM.  Alpha intrusion was absent.  Supine sleep was 48.57%.  RESPIRATORY PARAMETERS The overall apnea/hypopnea index (AHI) was 37.1 per hour.  The respiratory index (RDI) was  39.4/h. There were 0 total apneas, including 0 obstructive, 0 central and 0 mixed apneas. There were 65 hypopneas and 4 RERAs.  The AHI during Stage REM sleep was N/A per hour.  AHI while supine was 72.9 per hour.  The mean oxygen saturation was 94.2%. The minimum SpO2 during sleep was 85.0%.  Moderate snoring was noted during this study.  CARDIAC DATA The 2 lead EKG demonstrated sinus rhythm. The mean heart rate was 75.8 beats per minute. Other EKG findings include: None.  LEG MOVEMENT DATA The total PLMS were 0 with a resulting PLMS index of 0.0. Associated arousal with leg movement index was 0.0 .  IMPRESSIONS - Severe obstructive sleep apnea occurred during this study (AHI 37.1/h). Events were more severe with supine position (AHI 72.9/h). There was absence of REM sleep.  - No significant central sleep apnea occurred during this study (CAI = 0.0/h). - Mild oxygen desaturation to a nadir of  85%. - Abnormal sleep architecture with absence of slow wave and REM sleep. - The patient snored with moderate snoring volume. - No cardiac abnormalities were noted during this study. - Clinically significant periodic limb movements did not occur during sleep. No significant associated arousals.  DIAGNOSIS - Obstructive Sleep Apnea (327.23 [G47.33 ICD-10]) - Nocturnal Hypoxemia (327.26 [G47.36 ICD-10])  RECOMMENDATIONS - Therapeutic CPAP titration to determine optimal pressure required to alleviate sleep disordered breathing. - Eforts should be made to optimize nasal and orpharyngeal patency. - Positional therapy avoiding  supine position during sleep. - Avoid alcohol, sedatives and other CNS depressants that may worsen sleep apnea and disrupt normal sleep architecture. - Sleep hygiene should be reviewed to assess factors that may improve sleep quality. - Weight management and regular exercise should be initiated or continued if appropriate.  [Electronically signed] 08/17/2018 09:51  AM  Nicki Guadalajarahomas Kelly MD, [facility] - NorthwestFACC, ABSM Diplomate, American Board of Sleep Medicine   NPI: [id number] [facility] PH: [phone]) [phone]   FX: [phone]) [phone] ACCREDITED BY THE AMERICAN ACADEMY OF SLEEP MEDICINE

## 2018-08-19 ENCOUNTER — Other Ambulatory Visit: Payer: Self-pay

## 2018-08-19 ENCOUNTER — Encounter (HOSPITAL_COMMUNITY): Payer: Self-pay | Admitting: *Deleted

## 2018-08-19 ENCOUNTER — Emergency Department (HOSPITAL_COMMUNITY)
Admission: EM | Admit: 2018-08-19 | Discharge: 2018-08-19 | Disposition: A | Discharge status: Home / Self Care | Payer: No Typology Code available for payment source | Attending: Emergency Medicine | Admitting: Emergency Medicine

## 2018-08-19 DIAGNOSIS — F17290 Nicotine dependence, other tobacco product, uncomplicated: Secondary | ICD-10-CM | POA: Insufficient documentation

## 2018-08-19 DIAGNOSIS — G4730 Sleep apnea, unspecified: Secondary | ICD-10-CM | POA: Insufficient documentation

## 2018-08-19 DIAGNOSIS — R0981 Nasal congestion: Secondary | ICD-10-CM | POA: Insufficient documentation

## 2018-08-19 DIAGNOSIS — Z7982 Long term (current) use of aspirin: Secondary | ICD-10-CM | POA: Insufficient documentation

## 2018-08-19 DIAGNOSIS — R51 Headache: Secondary | ICD-10-CM | POA: Insufficient documentation

## 2018-08-19 DIAGNOSIS — Z79899 Other long term (current) drug therapy: Secondary | ICD-10-CM | POA: Insufficient documentation

## 2018-08-19 DIAGNOSIS — I10 Essential (primary) hypertension: Secondary | ICD-10-CM | POA: Insufficient documentation

## 2018-08-19 DIAGNOSIS — R110 Nausea: Secondary | ICD-10-CM | POA: Insufficient documentation

## 2018-08-19 DIAGNOSIS — J069 Acute upper respiratory infection, unspecified: Secondary | ICD-10-CM

## 2018-08-19 MED ORDER — DEXAMETHASONE 4 MG PO TABS
10.0000 mg | ORAL_TABLET | Freq: Once | ORAL | Status: AC
  Administered 2018-08-19: 10 mg via ORAL
  Filled 2018-08-19: qty 3

## 2018-08-19 NOTE — Discharge Instructions (Addendum)
Saline spray to nose. Apply Vaseline to nose prior to going to sleep. Drink plenty of water during the day to help keep mucous membranes hydrated. Follow up with your doctor for further management of your sleep apnea.

## 2018-08-19 NOTE — ED Notes (Signed)
ED Provider at bedside. 
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
## 2018-08-19 NOTE — ED Provider Notes (Signed)
[facility]   CSN: [id number] Arrival date & time: 08/19/18  1536     History   Chief Complaint Chief Complaint  Patient presents with  . Dizziness  . Nausea  . Headache  . Nasal Congestion    HPI Samuel Austin is a 37 y.o. male.  37 yo male presents with complaint of sinus congestion, cough, headache/dizziness, abdominal cramping and nausea, onset last night. Denies changes in bowel or bladder habits, fever, sick contacts.  States that he recently had a sleep study, suspects he has sleep apnea but has not gotten his results yet.  Patient also questions whether or not he needs sinus surgery due to frequent nasal congestion which did not improve with antibiotics and Flonase.  No other complaints or concerns.     Past Medical History:  Diagnosis Date  . Depression   . Hyperchloremia   . Hypertension     Patient Active Problem List   Diagnosis Date Noted  . Dizziness 04/13/2018  . Hypertension 03/11/2018  . Hyperlipidemia 03/11/2018  . Episodic mood disorder (HCC) 03/11/2018    History reviewed. No pertinent surgical history.      Home Medications    Prior to Admission medications   Medication Sig Start Date End Date Taking? Authorizing Provider  aspirin 325 MG tablet Take 650 mg by mouth every 6 (six) hours as needed for mild pain, fever or headache.    Yes [provider]  hydrOXYzine (ATARAX/VISTARIL) 25 MG tablet Take 1 tablet (25 mg total) by mouth every 8 (eight) hours as needed for up to 20 doses for anxiety. 08/16/18  Yes Julieanne MansonMulberry, Elizabeth, MD  lisinopril-hydrochlorothiazide (PRINZIDE,ZESTORETIC) 20-25 MG tablet Take 1 tablet by mouth daily. 04/13/18  Yes Julieanne MansonMulberry, Elizabeth, MD  prazosin (MINIPRESS) 5 MG capsule Take 10 mg by mouth at bedtime.   Yes [provider]  aspirin EC 81 MG tablet Take 1 tablet (81 mg total) by mouth daily. Patient not taking: Reported on 07/27/2018  05/27/18   Julieanne MansonMulberry, Elizabeth, MD    Family History No family history on file.  Social History Social History   Tobacco Use  . Smoking status: Current Some Day Smoker    Types: Cigars  . Smokeless tobacco: Never Used  Substance Use Topics  . Alcohol use: Yes    Comment: occ  . Drug use: Not Currently    Types: Marijuana     Allergies   Motrin [ibuprofen] and Lactose intolerance (gi)   Review of Systems Review of Systems  Constitutional: Positive for chills. Negative for diaphoresis and fever.  HENT: Positive for congestion, postnasal drip, sinus pressure and sore throat. Negative for ear pain and sneezing.   Respiratory: Positive for cough. Negative for shortness of breath.   Cardiovascular: Negative for chest pain.  Gastrointestinal: Positive for abdominal pain and nausea. Negative for constipation, diarrhea and vomiting.  Genitourinary: Negative for decreased urine volume and difficulty urinating.  Musculoskeletal: Negative for arthralgias and myalgias.  Skin: Negative for wound.  Neurological: Positive for dizziness and headaches.  Hematological: Negative for adenopathy.  Psychiatric/Behavioral: Negative for confusion.  All other systems reviewed and are negative.    Physical Exam Updated Vital Signs BP 121/72   Pulse 73   Temp 97.7 F (36.5 C) (Oral)   Resp 16   SpO2 97%   Physical Exam Vitals signs and nursing note reviewed.  Constitutional:      General: He is not in acute distress.  Appearance: He is well-developed. He is not diaphoretic.  HENT:     Head: Normocephalic and atraumatic.     Right Ear: There is impacted cerumen.     Left Ear: There is impacted cerumen.     Nose:     Right Turbinates: Enlarged.     Left Turbinates: Enlarged.     Right Sinus: No maxillary sinus tenderness or frontal sinus tenderness.     Left Sinus: No maxillary sinus tenderness or frontal sinus tenderness.     Mouth/Throat:     Mouth: Mucous membranes are moist.    Eyes:     Extraocular Movements: Extraocular movements intact.  Neck:     Musculoskeletal: Neck supple.  Cardiovascular:     Rate and Rhythm: Normal rate and regular rhythm.     Heart sounds: Normal heart sounds. No murmur.  Pulmonary:     Effort: Pulmonary effort is normal.     Breath sounds: Normal breath sounds. No wheezing.  Abdominal:     Palpations: Abdomen is soft.     Tenderness: There is no abdominal tenderness.  Skin:    General: Skin is warm and dry.  Neurological:     Mental Status: He is alert and oriented to person, place, and time.     GCS: GCS eye subscore is 4. GCS verbal subscore is 5. GCS motor subscore is 6.     Cranial Nerves: No cranial nerve deficit.  Psychiatric:        Mood and Affect: Mood normal.        Speech: Speech normal.        Behavior: Behavior normal.      ED Treatments / Results  Labs (all labs ordered are listed, but only abnormal results are displayed) Labs Reviewed - No data to display  EKG None  Radiology No results found.  Procedures Procedures (including critical care time)  Medications Ordered in ED Medications  dexamethasone (DECADRON) tablet 10 mg (has no administration in time range)     Initial Impression / Assessment and Plan / ED Course  I have reviewed the triage vital signs and the nursing notes.  Pertinent labs & imaging results that were available during my care of the patient were reviewed by me and considered in my medical decision making (see chart for details).  Clinical Course as of Aug 19 1806  Thu Aug 19, 2018  [id number] 37 year old male presents with complaint of worsening sinus congestion.  Review of recent PCP and ER notes, patient had a sleep study on December 10, recently resulted with diagnosis of sleep apnea, he has not been back to his PCP and was not aware of this diagnosis prior to today's visit.  Patient reports his congestion is worse in the morning when he first wakes up and states he has  headaches and feels dizzy upon waking in the morning which is a chronic complaint for him.  Also reports dry mucous membranes when he wakes up in the morning.  Discussed patient's sleep study and recommend he contact his PCP to schedule an appointment to discuss CPAP use.  Recommend he continue with his humidifier in the room, use saline sinus rinse to keep mucous membranes moist and increase water intake throughout the day.  Patient was given dose of Decadron while in the ER today.  Patient to follow-up with PCP.   [LM]    Clinical Course User Index [LM] Jeannie FendMurphy, Viviane Semidey A, PA-C   Final Clinical Impressions(s) / ED Diagnoses  Final diagnoses:  Viral upper respiratory tract infection  Sleep apnea, unspecified type    ED Discharge Orders    None       Jeannie Fend, PA-C 08/19/18 1807    Loren Racer, MD 08/23/18 [phone]

## 2018-08-19 NOTE — ED Notes (Addendum)
Pt verbalizes understanding of d/c instructions. Pt ambulatory at d/c with all belongings.   

## 2018-08-21 DIAGNOSIS — F959 Tic disorder, unspecified: Secondary | ICD-10-CM | POA: Insufficient documentation

## 2018-08-23 ENCOUNTER — Ambulatory Visit: Payer: Self-pay | Admitting: Internal Medicine

## 2018-08-23 ENCOUNTER — Telehealth: Payer: Self-pay | Admitting: Internal Medicine

## 2018-08-23 ENCOUNTER — Other Ambulatory Visit: Payer: Self-pay | Admitting: Cardiovascular Disease

## 2018-08-23 DIAGNOSIS — G4733 Obstructive sleep apnea (adult) (pediatric): Secondary | ICD-10-CM

## 2018-08-23 DIAGNOSIS — I10 Essential (primary) hypertension: Secondary | ICD-10-CM

## 2018-08-23 NOTE — Telephone Encounter (Signed)
Forward to sleep coordinatorBurna Austin- Wanda CMA

## 2018-08-23 NOTE — Telephone Encounter (Signed)
-----   Message from Lennette Biharihomas A Kelly, MD sent at 08/17/2018  9:55 AM EST ----- Burna MortimerWanda, please notify pt and set up doe CPAP titration study.

## 2018-08-23 NOTE — Telephone Encounter (Signed)
New Message         Patient is calling today to see when he will receive his C-pap machine. Patient has the hose mask, but machine. Pls call and advise.

## 2018-08-23 NOTE — Telephone Encounter (Signed)
Patient notified of sleep study results and recommendations. CPAP titration scheduled for 10/03/18.

## 2018-08-25 ENCOUNTER — Emergency Department (HOSPITAL_COMMUNITY)
Admission: EM | Admit: 2018-08-25 | Discharge: 2018-08-25 | Disposition: A | Discharge status: Home / Self Care | Payer: No Typology Code available for payment source | Attending: Emergency Medicine | Admitting: Emergency Medicine

## 2018-08-25 ENCOUNTER — Encounter (HOSPITAL_COMMUNITY): Payer: Self-pay

## 2018-08-25 DIAGNOSIS — Z79899 Other long term (current) drug therapy: Secondary | ICD-10-CM | POA: Insufficient documentation

## 2018-08-25 DIAGNOSIS — R110 Nausea: Secondary | ICD-10-CM | POA: Insufficient documentation

## 2018-08-25 DIAGNOSIS — R5383 Other fatigue: Secondary | ICD-10-CM | POA: Insufficient documentation

## 2018-08-25 DIAGNOSIS — R52 Pain, unspecified: Secondary | ICD-10-CM | POA: Insufficient documentation

## 2018-08-25 DIAGNOSIS — I10 Essential (primary) hypertension: Secondary | ICD-10-CM | POA: Insufficient documentation

## 2018-08-25 DIAGNOSIS — E785 Hyperlipidemia, unspecified: Secondary | ICD-10-CM | POA: Insufficient documentation

## 2018-08-25 DIAGNOSIS — R509 Fever, unspecified: Secondary | ICD-10-CM | POA: Insufficient documentation

## 2018-08-25 DIAGNOSIS — R6889 Other general symptoms and signs: Secondary | ICD-10-CM

## 2018-08-25 DIAGNOSIS — R0981 Nasal congestion: Secondary | ICD-10-CM | POA: Insufficient documentation

## 2018-08-25 DIAGNOSIS — F17290 Nicotine dependence, other tobacco product, uncomplicated: Secondary | ICD-10-CM | POA: Insufficient documentation

## 2018-08-25 MED ORDER — BENZONATATE 100 MG PO CAPS: 100.0000 mg | ORAL_CAPSULE | Freq: Three times a day (TID) | ORAL | 0 refills | Status: DC

## 2018-08-25 MED ORDER — ONDANSETRON 4 MG PO TBDP
4.0000 mg | ORAL_TABLET | Freq: Once | ORAL | Status: AC
  Administered 2018-08-25: 4 mg via ORAL
  Filled 2018-08-25: qty 1

## 2018-08-25 MED ORDER — ACETAMINOPHEN 500 MG PO TABS: 500.0000 mg | ORAL_TABLET | Freq: Four times a day (QID) | ORAL | 0 refills | Status: DC | PRN

## 2018-08-25 MED ORDER — ONDANSETRON 4 MG PO TBDP: 4.0000 mg | ORAL_TABLET | Freq: Three times a day (TID) | ORAL | 0 refills | Status: DC | PRN

## 2018-08-25 NOTE — ED Provider Notes (Signed)
[facility] EMERGENCY DEPARTMENT Provider Note   CSN: [id number] Arrival date & time: 08/25/18  1947     History   Chief Complaint Chief Complaint  Patient presents with  . Influenza    HPI Samuel Austin is a 38 y.o. male.  The history is provided by the patient and medical records.  Influenza  Presenting symptoms: cough, fever, myalgias and nausea   Associated symptoms: nasal congestion      38 year old male with history of hypertension, depression, anxiety, presenting to the ED with flulike symptoms that began abruptly last night.  He reports fever, nasal congestion, nausea, generalized body aches, and fatigue.  States he has been trying to drink water throughout the day but has not been eating very much.  He has had a couple of episodes of lightheadedness with standing.  Overall he just feels very poorly.  Was around some family members over Christmas but no one sick with flu or other illness that he was aware of.  He did not get a flu vaccine this year.  He did try taking 2 Advil at home without any change in symptoms.  Past Medical History:  Diagnosis Date  . Depression   . Hyperchloremia   . Hypertension     Patient Active Problem List   Diagnosis Date Noted  . Tic disorder 08/21/2018  . Dizziness 04/13/2018  . Hypertension 03/11/2018  . Hyperlipidemia 03/11/2018  . Episodic mood disorder (HCC) 03/11/2018    History reviewed. No pertinent surgical history.      Home Medications    Prior to Admission medications   Medication Sig Start Date End Date Taking? Authorizing Provider  aspirin 325 MG tablet Take 650 mg by mouth every 6 (six) hours as needed for mild pain, fever or headache.     [provider]  aspirin EC 81 MG tablet Take 1 tablet (81 mg total) by mouth daily. Patient not taking: Reported on 07/27/2018 05/27/18   Julieanne Manson, MD  hydrOXYzine (ATARAX/VISTARIL) 25 MG tablet Take 1 tablet (25 mg total) by  mouth every 8 (eight) hours as needed for up to 20 doses for anxiety. 08/16/18   Julieanne Manson, MD  lisinopril-hydrochlorothiazide (PRINZIDE,ZESTORETIC) 20-25 MG tablet Take 1 tablet by mouth daily. 04/13/18   Julieanne Manson, MD  prazosin (MINIPRESS) 5 MG capsule Take 10 mg by mouth at bedtime.    [provider]    Family History No family history on file.  Social History Social History   Tobacco Use  . Smoking status: Current Some Day Smoker    Types: Cigars  . Smokeless tobacco: Never Used  Substance Use Topics  . Alcohol use: Yes    Comment: occ  . Drug use: Not Currently    Types: Marijuana     Allergies   Motrin [ibuprofen] and Lactose intolerance (gi)   Review of Systems Review of Systems  Constitutional: Positive for fever.  HENT: Positive for congestion.   Respiratory: Positive for cough.   Gastrointestinal: Positive for nausea.  Musculoskeletal: Positive for myalgias.  All other systems reviewed and are negative.    Physical Exam Updated Vital Signs BP 135/79 (BP Location: Right Arm)   Pulse 85   Temp 98.2 F (36.8 C) (Oral)   Resp 18   SpO2 97%   Physical Exam Vitals signs and nursing note reviewed.  Constitutional:      Appearance: He is well-developed.     Comments: Looks to feel poorly but non-toxic  HENT:  Head: Normocephalic and atraumatic.     Right Ear: Tympanic membrane and ear canal normal.     Left Ear: Tympanic membrane and ear canal normal.     Nose: Congestion present.     Mouth/Throat:     Lips: Pink.     Mouth: Mucous membranes are moist.     Pharynx: No oropharyngeal exudate.     Comments: PND present Eyes:     Conjunctiva/sclera: Conjunctivae normal.     Pupils: Pupils are equal, round, and reactive to light.  Neck:     Musculoskeletal: Normal range of motion.  Cardiovascular:     Rate and Rhythm: Normal rate and regular rhythm.     Heart sounds: Normal heart sounds.  Pulmonary:     Effort:  Pulmonary effort is normal.     Breath sounds: Normal breath sounds. No decreased breath sounds, wheezing or rhonchi.  Abdominal:     General: Bowel sounds are normal.     Palpations: Abdomen is soft.     Tenderness: There is no abdominal tenderness.     Comments: Soft, benign  Musculoskeletal: Normal range of motion.  Skin:    General: Skin is warm and dry.  Neurological:     Mental Status: He is alert and oriented to person, place, and time.      ED Treatments / Results  Labs (all labs ordered are listed, but only abnormal results are displayed) Labs Reviewed - No data to display  EKG None  Radiology No results found.  Procedures Procedures (including critical care time)  Medications Ordered in ED Medications - No data to display   Initial Impression / Assessment and Plan / ED Course  I have reviewed the triage vital signs and the nursing notes.  Pertinent labs & imaging results that were available during my care of the patient were reviewed by me and considered in my medical decision making (see chart for details).  38 year old male here with flulike symptoms that began abruptly last night.  Does report being around some family over Christmas no sick contacts known.  He is afebrile and nontoxic in appearance.  He does appear to be feeling poorly.  His exam is overall benign aside from some nasal congestion and postnasal drip.  Lungs are clear without any wheezes or rhonchi.  Abdomen is soft and benign.  Concern for possible influenza.  He is within the 48-hour window of symptom onset so discussed options of Tamiflu, he opted for symptomatic management.  Encouraged rest and oral hydration.  Close follow-up with PCP.  Return here for any new or worsening symptoms.  Final Clinical Impressions(s) / ED Diagnoses   Final diagnoses:  Flu-like symptoms  Nausea    ED Discharge Orders         Ordered    ondansetron (ZOFRAN ODT) 4 MG disintegrating tablet  Every 8 hours PRN      08/25/18 2249    benzonatate (TESSALON) 100 MG capsule  Every 8 hours     08/25/18 2249    acetaminophen (TYLENOL) 500 MG tablet  Every 6 hours PRN     08/25/18 2249           Garlon HatchetSanders, Kaelei Wheeler M, PA-C 08/25/18 2252    Charlynne PanderYao, David Hsienta, MD 08/26/18 [phone]

## 2018-08-25 NOTE — ED Notes (Signed)
No answer from patient x2 from waiting room

## 2018-08-25 NOTE — Discharge Instructions (Signed)
Take the prescribed medication as directed.  Make sure to rest and drink oral fluids. Follow-up with your primary care doctor. Return to the ED for new or worsening symptoms.

## 2018-08-25 NOTE — ED Triage Notes (Signed)
Pt states that since yesterday he has been having headache, nausea, some dizziness, fevers, congestion, body aches.

## 2018-08-25 NOTE — ED Notes (Signed)
Patient verbalizes understanding of discharge instructions. Opportunity for questioning and answers were provided. Armband removed by staff, pt discharged from ED ambulatory.   

## 2018-08-27 ENCOUNTER — Emergency Department (HOSPITAL_COMMUNITY): Payer: No Typology Code available for payment source

## 2018-08-27 ENCOUNTER — Encounter (HOSPITAL_COMMUNITY): Payer: Self-pay | Admitting: *Deleted

## 2018-08-27 ENCOUNTER — Ambulatory Visit (HOSPITAL_BASED_OUTPATIENT_CLINIC_OR_DEPARTMENT_OTHER)
Payer: No Typology Code available for payment source | Attending: Cardiovascular Disease | Admitting: Cardiovascular Disease

## 2018-08-27 ENCOUNTER — Emergency Department (HOSPITAL_COMMUNITY)
Admission: EM | Admit: 2018-08-27 | Discharge: 2018-08-27 | Discharge status: Against Medical Advice | Payer: No Typology Code available for payment source | Attending: Emergency Medicine | Admitting: Emergency Medicine

## 2018-08-27 ENCOUNTER — Encounter

## 2018-08-27 VITALS — Ht 61.0 in | Wt 239.0 lb

## 2018-08-27 DIAGNOSIS — Z9989 Dependence on other enabling machines and devices: Secondary | ICD-10-CM

## 2018-08-27 DIAGNOSIS — R0602 Shortness of breath: Secondary | ICD-10-CM | POA: Insufficient documentation

## 2018-08-27 DIAGNOSIS — G4733 Obstructive sleep apnea (adult) (pediatric): Secondary | ICD-10-CM | POA: Insufficient documentation

## 2018-08-27 DIAGNOSIS — I10 Essential (primary) hypertension: Secondary | ICD-10-CM | POA: Insufficient documentation

## 2018-08-27 DIAGNOSIS — R079 Chest pain, unspecified: Secondary | ICD-10-CM | POA: Insufficient documentation

## 2018-08-27 DIAGNOSIS — Z5321 Procedure and treatment not carried out due to patient leaving prior to being seen by health care provider: Secondary | ICD-10-CM | POA: Insufficient documentation

## 2018-08-27 DIAGNOSIS — R200 Anesthesia of skin: Secondary | ICD-10-CM | POA: Insufficient documentation

## 2018-08-27 LAB — BASIC METABOLIC PANEL
Anion gap: 13 (ref 5–15)
BUN: 9 mg/dL (ref 6–20)
CALCIUM: 10.1 mg/dL (ref 8.9–10.3)
CO2: 20 mmol/L — ABNORMAL LOW (ref 22–32)
CREATININE: 1.43 mg/dL — AB (ref 0.61–1.24)
Chloride: 102 mmol/L (ref 98–111)
GFR calc Af Amer: >60 mL/min (ref >60)
GFR calc non Af Amer: >60 mL/min (ref >60)
Glucose, Bld: 106 mg/dL — ABNORMAL HIGH (ref 70–99)
Potassium: 4.2 mmol/L (ref 3.5–5.1)
Sodium: 135 mmol/L (ref 135–145)

## 2018-08-27 LAB — CBC
HCT: 54.8 % — ABNORMAL HIGH (ref 39.0–52.0)
Hemoglobin: 17.8 g/dL — ABNORMAL HIGH (ref 13.0–17.0)
MCH: 29 pg (ref 26.0–34.0)
MCHC: 32.5 g/dL (ref 30.0–36.0)
MCV: 89.4 fL (ref 80.0–100.0)
Platelets: 342 10*3/uL (ref 150–400)
RBC: 6.13 MIL/uL — ABNORMAL HIGH (ref 4.22–5.81)
RDW: 11.9 % (ref 11.5–15.5)
WBC: 7.1 10*3/uL (ref 4.0–10.5)
nRBC: 0 % (ref 0.0–0.2)

## 2018-08-27 LAB — I-STAT TROPONIN, ED: Troponin i, poc: 0 ng/mL (ref 0.00–0.08)

## 2018-08-27 NOTE — ED Notes (Signed)
Pt called x 2 with no answer  

## 2018-08-27 NOTE — ED Notes (Signed)
Pt Called by registration and this emt x6 times. Pt not seen. [place] and wheel chair found empty.

## 2018-08-27 NOTE — ED Triage Notes (Signed)
Pt in hyperventilating stating he can't breath, pt states symptoms started this morning with tingling all over and shortness of breath, pt currently c/o hand cramping and calf cramping, instructed to slow breathing down, symptoms improved as respirations improved

## 2018-08-27 NOTE — ED Triage Notes (Signed)
Pt here with c/o anxiety attack ,sob and some chest pain , pt took his meds this morning but it did not help

## 2018-08-27 NOTE — ED Notes (Addendum)
Pt reports sob and cp again, same sx and states that something is wrong. Reports sx keeps coming and going. Pt hyperventilating. Reports everybody keeps ignoring him. Repeat EKG completed. Blood work drawn. Explained rooming process to pt and will keep him updated.

## 2018-08-31 ENCOUNTER — Ambulatory Visit: Payer: Self-pay

## 2018-08-31 ENCOUNTER — Other Ambulatory Visit: Payer: Self-pay

## 2018-08-31 ENCOUNTER — Emergency Department (HOSPITAL_COMMUNITY)
Admission: EM | Admit: 2018-08-31 | Discharge: 2018-08-31 | Disposition: A | Discharge status: Home / Self Care | Payer: No Typology Code available for payment source | Attending: Emergency Medicine | Admitting: Emergency Medicine

## 2018-08-31 ENCOUNTER — Encounter (HOSPITAL_COMMUNITY): Payer: Self-pay | Admitting: Emergency Medicine

## 2018-08-31 VITALS — BP 124/78 | HR 70

## 2018-08-31 DIAGNOSIS — I10 Essential (primary) hypertension: Secondary | ICD-10-CM

## 2018-08-31 DIAGNOSIS — R079 Chest pain, unspecified: Secondary | ICD-10-CM | POA: Insufficient documentation

## 2018-08-31 DIAGNOSIS — R42 Dizziness and giddiness: Secondary | ICD-10-CM | POA: Insufficient documentation

## 2018-08-31 DIAGNOSIS — R0602 Shortness of breath: Secondary | ICD-10-CM | POA: Insufficient documentation

## 2018-08-31 DIAGNOSIS — Z79899 Other long term (current) drug therapy: Secondary | ICD-10-CM | POA: Insufficient documentation

## 2018-08-31 DIAGNOSIS — F419 Anxiety disorder, unspecified: Secondary | ICD-10-CM | POA: Insufficient documentation

## 2018-08-31 DIAGNOSIS — R5383 Other fatigue: Secondary | ICD-10-CM | POA: Insufficient documentation

## 2018-08-31 DIAGNOSIS — F17290 Nicotine dependence, other tobacco product, uncomplicated: Secondary | ICD-10-CM | POA: Insufficient documentation

## 2018-08-31 DIAGNOSIS — R531 Weakness: Secondary | ICD-10-CM | POA: Insufficient documentation

## 2018-08-31 DIAGNOSIS — R202 Paresthesia of skin: Secondary | ICD-10-CM | POA: Insufficient documentation

## 2018-08-31 MED ORDER — MECLIZINE HCL 25 MG PO TABS
50.0000 mg | ORAL_TABLET | Freq: Once | ORAL | Status: AC
  Administered 2018-08-31: 50 mg via ORAL
  Filled 2018-08-31: qty 2

## 2018-08-31 MED ORDER — MECLIZINE HCL 25 MG PO TABS: 25.0000 mg | ORAL_TABLET | Freq: Three times a day (TID) | ORAL | 0 refills | Status: DC | PRN

## 2018-08-31 NOTE — ED Notes (Signed)
Pt said he became dizzy when he stood up. Pt now back in bed and resting.

## 2018-08-31 NOTE — ED Triage Notes (Addendum)
Pt had headache, dizziness, chills for 3 days. Denies other symptoms. Pt states he was here 4 days ago and LWBS had blood work taken here

## 2018-08-31 NOTE — Progress Notes (Signed)
Patient came in worried that his BP was high. States he was having a headache and dizziness earlier today. Informed patient BP in normal range and to practice calming techniques. Patient verbalized understanding

## 2018-08-31 NOTE — ED Provider Notes (Signed)
[facility] EMERGENCY DEPARTMENT Provider Note   CSN: [id number] Arrival date & time: 08/31/18  1634     History   Chief Complaint Chief Complaint  Patient presents with  . URI    HPI Samuel Austin is a 38 y.o. male.  HPI Samuel Austin is a 38 y.o. male presents to emergency department with complaint of dizziness, lightheadedness, fatigue.  Patient states symptoms have been of 4 weeks.  He reports he has been here and initially thought that he had flulike symptoms.  He states however his symptoms did not get better and he never developed any upper respiratory symptoms.  He states that he has continued to feel bad, at times reports tingling in his hands, at times gets short of breath, at times has chest pressure.  He states all the symptoms come and go.  He does report anxiety was even seen in emergency department for the same in the past and was given hydroxyzine which is helping.  He states sometimes "I just feel like I am going to die."  He states that he thinks his main symptom is the dizziness that he feels when he changes position.  He describes sensation of floating and spinning.  He states that most recently he had a sleep study done that determined that he has severe sleep apnea.  He is waiting to receive his CPAP machine.  He states the day that he slept with a CPAP machine during a testing run, it was the best night of sleep he has ever gotten.  He states that he had no symptoms in the morning, no headache, no weakness, no dizziness.  He states he does not do any drugs.  He does not drink alcohol.    Past Medical History:  Diagnosis Date  . Depression   . Hyperchloremia   . Hypertension     Patient Active Problem List   Diagnosis Date Noted  . Tic disorder 08/21/2018  . Dizziness 04/13/2018  . Hypertension 03/11/2018  . Hyperlipidemia 03/11/2018  . Episodic mood disorder (HCC) 03/11/2018    History reviewed. No pertinent surgical  history.      Home Medications    Prior to Admission medications   Medication Sig Start Date End Date Taking? Authorizing Provider  acetaminophen (TYLENOL) 500 MG tablet Take 1 tablet (500 mg total) by mouth every 6 (six) hours as needed. 08/25/18   Garlon HatchetSanders, Lisa M, PA-C  aspirin 325 MG tablet Take 650 mg by mouth every 6 (six) hours as needed for mild pain, fever or headache.     [provider]  aspirin EC 81 MG tablet Take 1 tablet (81 mg total) by mouth daily. Patient not taking: Reported on 07/27/2018 05/27/18   Julieanne MansonMulberry, Elizabeth, MD  benzonatate (TESSALON) 100 MG capsule Take 1 capsule (100 mg total) by mouth every 8 (eight) hours. 08/25/18   Garlon HatchetSanders, Lisa M, PA-C  hydrOXYzine (ATARAX/VISTARIL) 25 MG tablet Take 1 tablet (25 mg total) by mouth every 8 (eight) hours as needed for up to 20 doses for anxiety. 08/16/18   Julieanne MansonMulberry, Elizabeth, MD  lisinopril-hydrochlorothiazide (PRINZIDE,ZESTORETIC) 20-25 MG tablet Take 1 tablet by mouth daily. 04/13/18   Julieanne MansonMulberry, Elizabeth, MD  ondansetron (ZOFRAN ODT) 4 MG disintegrating tablet Take 1 tablet (4 mg total) by mouth every 8 (eight) hours as needed for nausea. 08/25/18   Garlon HatchetSanders, Lisa M, PA-C  prazosin (MINIPRESS) 5 MG capsule Take 10 mg by mouth at bedtime.    [provider]    Family History No family history on file.  Social History Social History   Tobacco Use  . Smoking status: Current Some Day Smoker    Types: Cigars  . Smokeless tobacco: Never Used  Substance Use Topics  . Alcohol use: Yes    Comment: occ  . Drug use: Not Currently    Types: Marijuana     Allergies   Motrin [ibuprofen] and Lactose intolerance (gi)   Review of Systems Review of Systems  Constitutional: Positive for fatigue. Negative for chills and fever.  Respiratory: Positive for chest tightness and shortness of breath. Negative for cough.   Cardiovascular: Positive for chest pain. Negative for palpitations and leg swelling.    Gastrointestinal: Negative for abdominal distention, abdominal pain, diarrhea, nausea and vomiting.  Genitourinary: Negative for dysuria, frequency, hematuria and urgency.  Musculoskeletal: Negative for arthralgias, myalgias, neck pain and neck stiffness.  Skin: Negative for rash.  Allergic/Immunologic: Negative for immunocompromised state.  Neurological: Positive for dizziness, weakness, light-headedness and headaches. Negative for numbness.  All other systems reviewed and are negative.    Physical Exam Updated Vital Signs BP 117/68   Pulse 73   Temp 97.8 F (36.6 C) (Oral)   Resp 16   Ht 5\' 4"  (1.626 m)   Wt 106.6 kg   SpO2 98%   BMI 40.34 kg/m   Physical Exam Vitals signs and nursing note reviewed.  Constitutional:      General: He is not in acute distress.    Appearance: He is well-developed.  HENT:     Head: Normocephalic and atraumatic.  Eyes:     Conjunctiva/sclera: Conjunctivae normal.  Neck:     Musculoskeletal: Neck supple.  Cardiovascular:     Rate and Rhythm: Normal rate and regular rhythm.     Heart sounds: Normal heart sounds.  Pulmonary:     Effort: Pulmonary effort is normal. No respiratory distress.     Breath sounds: No wheezing or rales.  Abdominal:     General: Bowel sounds are normal. There is no distension.     Palpations: Abdomen is soft.     Tenderness: There is no abdominal tenderness. There is no rebound.  Skin:    General: Skin is warm and dry.     Capillary Refill: Capillary refill takes less than 2 seconds.  Neurological:     Mental Status: He is alert and oriented to person, place, and time.      ED Treatments / Results  Labs (all labs ordered are listed, but only abnormal results are displayed) Labs Reviewed - No data to display  EKG None  Radiology No results found.  Procedures Procedures (including critical care time)  Medications Ordered in ED Medications  meclizine (ANTIVERT) tablet 50 mg (50 mg Oral Given  08/31/18 2007)     Initial Impression / Assessment and Plan / ED Course  I have reviewed the triage vital signs and the nursing notes.  Pertinent labs & imaging results that were available during my care of the patient were reviewed by me and considered in my medical decision making (see chart for details).     Patient in emergency department with a several vague complaints.  Mainly complaining of vertigo-like symptoms, intermittent tingling in his hands, anxiety.  He has been seen multiple times in emergency department with similar symptoms.  He has had prior work-up including blood work, cardiac enzymes, EKGs.  Everything has been coming back unremarkable.  Most recently he had a sleep  study done which confirmed severe sleep apnea.  He is waiting to receive his machine.  He states during fitting test, it was the best sleeve that he has ever gotten.  He states he had no symptoms after.  I suspect that may be sleep apnea has a lot to do with his symptoms.  I also suspect a lot of anxiety.  I do not think he needs any further work-up today.  He is vital signs are normal.  He is not orthostatic.  I did give him some meclizine for his vertigo-like symptoms and he felt much better.  We will continue meclizine and instructed to call to follow-up on when he will receive his CPAP machine.  If his symptoms do not improve after using CPAP, he needs to follow-up with family doctor for further evaluation.  Return precautions discussed  Vitals:   08/31/18 1646 08/31/18 1720 08/31/18 2018  BP: 132/63  117/68  Pulse: 83  73  Resp: 16  16  Temp: 97.8 F (36.6 C)    TempSrc: Oral    SpO2: 97%  98%  Weight:  106.6 kg   Height:  5\' 4"  (1.626 m)      Final Clinical Impressions(s) / ED Diagnoses   Final diagnoses:  Dizziness  Weakness    ED Discharge Orders         Ordered    meclizine (ANTIVERT) 25 MG tablet  3 times daily PRN     08/31/18 2105           Jaynie CrumbleKirichenko, Josealfredo Adkins, PA-C 08/31/18  2106    Little, Ambrose Finlandachel Morgan, MD 08/31/18 2146

## 2018-08-31 NOTE — ED Notes (Signed)
Patient verbalizes understanding of discharge instructions. Opportunity for questioning and answers were provided. Armband removed by staff, pt discharged from ED ambulatory to home.  

## 2018-08-31 NOTE — Discharge Instructions (Signed)
Start taking meclizine as prescribed as needed for dizziness.  Make sure to continue to eat well-balanced diet and drink plenty of fluids.  Follow-up on your sleep apnea machine.  Follow-up with your doctor if symptoms are not improving

## 2018-08-31 NOTE — Telephone Encounter (Signed)
Samuel Austin:  Please call sleep lab and ask about the report regarding the number of apneas listed as 0 and then severe sleep apnea.

## 2018-09-01 ENCOUNTER — Telehealth: Payer: Self-pay | Admitting: *Deleted

## 2018-09-01 NOTE — Telephone Encounter (Signed)
Patient called to question when he is going to get his CPAP machine. States that "All of my problems are due to my sleep apnea." I informed him that Dr Tresa EndoKelly has not given me his CPAP orders yet. He is doing procedures today and may not be able to get to it today. Once he does I will send the order to the [employer] company to process.

## 2018-09-05 ENCOUNTER — Encounter (HOSPITAL_BASED_OUTPATIENT_CLINIC_OR_DEPARTMENT_OTHER): Payer: Self-pay | Admitting: Cardiovascular Disease

## 2018-09-05 NOTE — Procedures (Signed)
Patient Name: Samuel Austin, Samuel Austin Date: 08/27/2018 Gender: Male D.O.B: 06-Jun-1981 Age (years): 37 Referring Provider: Nicki Guadalajara MD, ABSM Height (inches): 64 Interpreting Physician: Nicki Guadalajara MD, ABSM Weight (lbs): 237 RPSGT: Lise Auer BMI: 41 MRN: [id number] Neck Size: 19.00  CLINICAL INFORMATION The patient is referred for a CPAP titration to treat sleep apnea.  Date of NPSG: 08/03/2018: AHI 37.4/h; RDI 39.4/h; supine sleep AHI 72.9/h; absent REM sleep.  SLEEP STUDY TECHNIQUE As per the AASM Manual for the Scoring of Sleep and Associated Events v2.3 (April 2016) with a hypopnea requiring 4% desaturations.  The channels recorded and monitored were frontal, central and occipital EEG, electrooculogram (EOG), submentalis EMG (chin), nasal and oral airflow, thoracic and abdominal wall motion, anterior tibialis EMG, snore microphone, electrocardiogram, and pulse oximetry. Continuous positive airway pressure (CPAP) was initiated at the beginning of the study and titrated to treat sleep-disordered breathing.  MEDICATIONS     acetaminophen (TYLENOL) 500 MG tablet             aspirin 325 MG tablet         aspirin EC 81 MG tablet         benzonatate (TESSALON) 100 MG capsule         hydrOXYzine (ATARAX/VISTARIL) 25 MG tablet         lisinopril-hydrochlorothiazide (PRINZIDE,ZESTORETIC) 20-25 MG tablet         meclizine (ANTIVERT) 25 MG tablet         ondansetron (ZOFRAN ODT) 4 MG disintegrating tablet         prazosin (MINIPRESS) 5 MG capsule      Medications self-administered by patient taken the night of the study : N/A  TECHNICIAN COMMENTS Comments added by technician: Patient tolerated CPAP well Comments added by scorer: N/A  RESPIRATORY PARAMETERS Optimal PAP Pressure (cm): 13 AHI at Optimal Pressure (/hr): 4.1 Overall Minimal O2 (%): 90.0 Supine % at Optimal Pressure (%): 0 Minimal O2 at Optimal Pressure (%): 91.0   SLEEP ARCHITECTURE The study  was initiated at 11:14:12 PM and ended at 5:30:44 AM.  Sleep onset time was 81.4 minutes and the sleep efficiency was 74.2%%. The total sleep time was 279.5 minutes.  The patient spent 1.8%% of the night in stage N1 sleep, 76.7%% in stage N2 sleep, 0.0%% in stage N3 and 21.5% in REM.Stage REM latency was 57.0 minutes  Wake after sleep onset was 15.6. Alpha intrusion was absent. Supine sleep was 27.37%.  CARDIAC DATA The 2 lead EKG demonstrated sinus rhythm. The mean heart rate was 68.5 beats per minute. Other EKG findings include: None.  LEG MOVEMENT DATA The total Periodic Limb Movements of Sleep (PLMS) were 0. The PLMS index was 0.0. A PLMS index of <15 is considered normal in adults.  IMPRESSIONS - CPAP was started at 5 cm and was titrated to 14 cm. AHI at 13 cm was 4.1/h with REM sleep but was 13.1/h at 14 cm without REM sleep. - Central sleep apnea was not noted during this titration (CAI = 0.2/h). - Significant oxygen desaturations were not observed during this titration with a nadir of 90.0% at 10 cm.. - The patient snored with soft snoring volume during this titration study. - No cardiac abnormalities were observed during this study. - Clinically significant periodic limb movements were not noted during this study. Arousals associated with PLMs were rare.  DIAGNOSIS - Obstructive Sleep Apnea (327.23 [G47.33 ICD-10])   RECOMMENDATIONS  - Recommend an initial trial of CPAP Auto  therapy with EPR at 13 - 20 cm H2O heated humidification. A Medium size Resmed Full Face Mask AirFit F20 mask was used for the titration.  - Efforts should be made to optimize nasal and oropharyngeal patency. - Avoid alcohol, sedatives and other CNS depressants that may worsen sleep apnea and disrupt normal sleep architecture. - Sleep hygiene should be reviewed to assess factors that may improve sleep quality. - Weight management and regular exercise should be initiated or continued. - Recommend a  download in 30 days and sleep clinic evaluation after 4 weeks of therapy.   [Electronically signed] 09/05/2018 09:30 AM  Nicki Guadalajara MD, [facility], ABSM Diplomate, American Board of Sleep Medicine   NPI: [id number] [facility] PH: [phone]   FX: [phone] ACCREDITED BY THE AMERICAN ACADEMY OF SLEEP MEDICINE

## 2018-09-09 NOTE — Telephone Encounter (Signed)
Spoke with sleep center. Person handling the call could not vive me the information. States she Is not sure of the interpretation. Spoke with patient and he has been back to be set for where his machine needs to be set for. Patient was already given mask and water sprout. States they are just waiting to get rest of sleep apnea equipment for patient.

## 2018-09-19 ENCOUNTER — Encounter: Payer: Self-pay | Admitting: Internal Medicine

## 2018-09-21 ENCOUNTER — Ambulatory Visit: Payer: No Typology Code available for payment source | Admitting: Internal Medicine

## 2018-09-22 ENCOUNTER — Encounter: Payer: Self-pay | Admitting: *Deleted

## 2018-09-24 ENCOUNTER — Ambulatory Visit: Payer: Self-pay

## 2018-09-24 VITALS — BP 122/78 | HR 70

## 2018-09-24 DIAGNOSIS — I10 Essential (primary) hypertension: Secondary | ICD-10-CM

## 2018-09-24 MED ORDER — MECLIZINE HCL 25 MG PO TABS: 25.0000 mg | ORAL_TABLET | Freq: Three times a day (TID) | ORAL | 0 refills | Status: DC | PRN

## 2018-09-24 NOTE — Progress Notes (Signed)
Patient BP in normal range. Informed to continue current dose of medication. Patient verbalized understanding. 

## 2018-10-03 ENCOUNTER — Encounter (HOSPITAL_BASED_OUTPATIENT_CLINIC_OR_DEPARTMENT_OTHER): Payer: Self-pay

## 2018-10-12 ENCOUNTER — Encounter: Payer: Self-pay | Admitting: Internal Medicine

## 2018-10-12 ENCOUNTER — Ambulatory Visit: Payer: Self-pay | Admitting: Internal Medicine

## 2018-10-12 VITALS — BP 110/70 | HR 74

## 2018-10-12 DIAGNOSIS — I10 Essential (primary) hypertension: Secondary | ICD-10-CM

## 2018-10-12 NOTE — Progress Notes (Signed)
Let patient know his bp is quite good.   He needs to let [facility] provider know about the supplement for mood he is taking.

## 2018-10-14 ENCOUNTER — Ambulatory Visit: Payer: Self-pay | Admitting: Internal Medicine

## 2018-10-26 ENCOUNTER — Ambulatory Visit: Payer: Self-pay | Admitting: Internal Medicine

## 2018-10-29 ENCOUNTER — Other Ambulatory Visit: Payer: Self-pay

## 2018-10-29 ENCOUNTER — Encounter (HOSPITAL_COMMUNITY): Payer: Self-pay

## 2018-10-29 ENCOUNTER — Emergency Department (HOSPITAL_COMMUNITY)
Admission: EM | Admit: 2018-10-29 | Discharge: 2018-10-30 | Disposition: A | Discharge status: Home / Self Care | Payer: Self-pay | Attending: Emergency Medicine | Admitting: Emergency Medicine

## 2018-10-29 ENCOUNTER — Emergency Department (HOSPITAL_COMMUNITY): Payer: Self-pay

## 2018-10-29 DIAGNOSIS — F17290 Nicotine dependence, other tobacco product, uncomplicated: Secondary | ICD-10-CM | POA: Insufficient documentation

## 2018-10-29 DIAGNOSIS — Z79899 Other long term (current) drug therapy: Secondary | ICD-10-CM | POA: Insufficient documentation

## 2018-10-29 DIAGNOSIS — R002 Palpitations: Secondary | ICD-10-CM

## 2018-10-29 DIAGNOSIS — E785 Hyperlipidemia, unspecified: Secondary | ICD-10-CM | POA: Insufficient documentation

## 2018-10-29 DIAGNOSIS — F410 Panic disorder [episodic paroxysmal anxiety] without agoraphobia: Secondary | ICD-10-CM | POA: Insufficient documentation

## 2018-10-29 DIAGNOSIS — I10 Essential (primary) hypertension: Secondary | ICD-10-CM | POA: Insufficient documentation

## 2018-10-29 LAB — CBC
HCT: 48 % (ref 39.0–52.0)
Hemoglobin: 16.1 g/dL (ref 13.0–17.0)
MCH: 29.6 pg (ref 26.0–34.0)
MCHC: 33.5 g/dL (ref 30.0–36.0)
MCV: 88.2 fL (ref 80.0–100.0)
Platelets: 314 10*3/uL (ref 150–400)
RBC: 5.44 MIL/uL (ref 4.22–5.81)
RDW: 12 % (ref 11.5–15.5)
WBC: 6.6 10*3/uL (ref 4.0–10.5)
nRBC: 0 % (ref 0.0–0.2)

## 2018-10-29 LAB — I-STAT TROPONIN, ED: TROPONIN I, POC: 0 ng/mL (ref 0.00–0.08)

## 2018-10-29 LAB — BASIC METABOLIC PANEL
Anion gap: 12 (ref 5–15)
BUN: 12 mg/dL (ref 6–20)
CO2: 22 mmol/L (ref 22–32)
CREATININE: 1.21 mg/dL (ref 0.61–1.24)
Calcium: 9.6 mg/dL (ref 8.9–10.3)
Chloride: 100 mmol/L (ref 98–111)
GFR calc Af Amer: >60 mL/min (ref >60)
GFR calc non Af Amer: >60 mL/min (ref >60)
Glucose, Bld: 148 mg/dL — ABNORMAL HIGH (ref 70–99)
Potassium: 3.6 mmol/L (ref 3.5–5.1)
Sodium: 134 mmol/L — ABNORMAL LOW (ref 135–145)

## 2018-10-29 MED ORDER — SODIUM CHLORIDE 0.9% FLUSH: 3.0000 mL | Freq: Once | INTRAVENOUS | Status: DC

## 2018-10-29 NOTE — ED Notes (Signed)
Pt requesting wait time. Apologized for the inconvenience.

## 2018-10-29 NOTE — ED Triage Notes (Signed)
Pt states he has been having palpitations and shortness of breath. Most recent episode today and had near syncope. Pt has been seen by cardiologist but has never had diagnosis.

## 2018-10-29 NOTE — ED Notes (Signed)
Pt requesting wait time.

## 2018-10-29 NOTE — ED Notes (Signed)
Pt once again requesting wait time.

## 2018-10-30 ENCOUNTER — Other Ambulatory Visit: Payer: Self-pay

## 2018-10-30 NOTE — ED Provider Notes (Signed)
[facility] EMERGENCY DEPARTMENT Provider Note   CSN: [id number] Arrival date & time: 10/29/18  1736    History   Chief Complaint Chief Complaint  Patient presents with  . Palpitations  . Shortness of Breath    HPI Samuel Austin is a 38 y.o. male with a history of HTN, hyperlipidemia, depression who presents to the emergency department with a chief complaint of shortness of breath.  The patient reports that he was sitting when he started to feel his heartbeat this afternoon.  He reports that he is very concerned that he may have a heart attack because his grandfather passed away from a heart attack last year.  He reports that shortly after feeling the onset of the palpitations that he became acutely short of breath, dizzy, lightheaded, "feeling hot all over" with a mild headache, blurred vision, and felt tingling in his bilateral hands.  He reports that the symptoms lasted for approximately 3 to 4 minutes before resolving completely.  He has no shortness of breath, dizziness, or lightheadedness at this time.  He reports a history of similar and was told that when he can start to feel his heartbeat that he should try to take deep breaths, which he did without improvement.  He was started on hydroxyzine for anxiety, but did not take this medication today because he read online that it can cause prolonged QT syndrome and he does not want to die of heart attack.  Reports that he attempted to put on his CPAP mask for a few minutes to calm himself down with improvement in his symptoms.  He denies fever, chills, chest pain, cough, URI symptoms, wheezing, leg swelling, syncope, seizure-like activity, abdominal pain, vomiting, diarrhea, constipation.  He is established with Dr. Tresa Endo with cardiology.  He is followed by behavioral health.     The history is provided by the patient. No language interpreter was used.    Past Medical History:  Diagnosis Date  .  Depression   . Hyperchloremia   . Hypertension     Patient Active Problem List   Diagnosis Date Noted  . Tic disorder 08/21/2018  . Dizziness 04/13/2018  . Hypertension 03/11/2018  . Hyperlipidemia 03/11/2018  . Episodic mood disorder (HCC) 03/11/2018    History reviewed. No pertinent surgical history.      Home Medications    Prior to Admission medications   Medication Sig Start Date End Date Taking? Authorizing Provider  acetaminophen (TYLENOL) 500 MG tablet Take 1 tablet (500 mg total) by mouth every 6 (six) hours as needed. 08/25/18   Garlon Hatchet, PA-C  aspirin 325 MG tablet Take 650 mg by mouth every 6 (six) hours as needed for mild pain, fever or headache.     [provider]  aspirin EC 81 MG tablet Take 1 tablet (81 mg total) by mouth daily. Patient not taking: Reported on 07/27/2018 05/27/18   Julieanne Manson, MD  benzonatate (TESSALON) 100 MG capsule Take 1 capsule (100 mg total) by mouth every 8 (eight) hours. 08/25/18   Garlon Hatchet, PA-C  hydrOXYzine (ATARAX/VISTARIL) 25 MG tablet Take 1 tablet (25 mg total) by mouth every 8 (eight) hours as needed for up to 20 doses for anxiety. 08/16/18   Julieanne Manson, MD  lisinopril-hydrochlorothiazide (PRINZIDE,ZESTORETIC) 20-25 MG tablet Take 1 tablet by mouth daily. 04/13/18   Julieanne Manson, MD  meclizine (ANTIVERT) 25 MG tablet Take 1 tablet (25 mg total) by mouth 3 (three) times daily as needed  for dizziness. 09/24/18   Julieanne Manson, MD  ondansetron (ZOFRAN ODT) 4 MG disintegrating tablet Take 1 tablet (4 mg total) by mouth every 8 (eight) hours as needed for nausea. 08/25/18   Garlon Hatchet, PA-C  prazosin (MINIPRESS) 5 MG capsule Take 10 mg by mouth at bedtime.    [provider]    Family History History reviewed. No pertinent family history.  Social History Social History   Tobacco Use  . Smoking status: Current Some Day Smoker    Types: Cigars  . Smokeless tobacco: Never  Used  Substance Use Topics  . Alcohol use: Yes    Comment: occ  . Drug use: Not Currently    Types: Marijuana     Allergies   Motrin [ibuprofen] and Lactose intolerance (gi)   Review of Systems Review of Systems  Constitutional: Negative for appetite change, chills, diaphoresis and fever.  HENT: Negative for congestion and sore throat.   Eyes: Positive for visual disturbance (blurred).  Respiratory: Positive for shortness of breath. Negative for cough, wheezing and stridor.   Cardiovascular: Positive for palpitations. Negative for chest pain and leg swelling.  Gastrointestinal: Negative for abdominal pain, diarrhea, nausea and vomiting.  Genitourinary: Negative for dysuria.  Musculoskeletal: Negative for back pain.  Skin: Negative for rash.  Allergic/Immunologic: Negative for immunocompromised state.  Neurological: Positive for dizziness, light-headedness and headaches. Negative for seizures and weakness.       Paresthesias  Psychiatric/Behavioral: Negative for confusion.     Physical Exam Updated Vital Signs BP 136/88   Pulse 74   Temp 98.5 F (36.9 C) (Oral)   Resp 16   Ht 5\' 4"  (1.626 m)   Wt 106.6 kg   SpO2 97%   BMI 40.34 kg/m   Physical Exam Vitals signs and nursing note reviewed.  Constitutional:      Appearance: He is well-developed.     Interventions: He is not intubated. HENT:     Head: Normocephalic.  Eyes:     Conjunctiva/sclera: Conjunctivae normal.  Neck:     Musculoskeletal: Neck supple.  Cardiovascular:     Rate and Rhythm: Normal rate and regular rhythm.     Heart sounds: No murmur. No friction rub. No gallop.   Pulmonary:     Effort: Pulmonary effort is normal. No tachypnea, bradypnea, accessory muscle usage or respiratory distress. He is not intubated.     Breath sounds: No stridor.  Abdominal:     General: There is no distension.     Palpations: Abdomen is soft.  Skin:    General: Skin is warm and dry.  Neurological:     Mental  Status: He is alert.  Psychiatric:        Mood and Affect: Mood is anxious.        Behavior: Behavior normal.      ED Treatments / Results  Labs (all labs ordered are listed, but only abnormal results are displayed) Labs Reviewed  BASIC METABOLIC PANEL - Abnormal; Notable for the following components:      Result Value   Sodium 134 (*)    Glucose, Bld 148 (*)    All other components within normal limits  CBC  I-STAT TROPONIN, ED    EKG None  Radiology Dg Chest 2 View  Result Date: 10/29/2018 CLINICAL DATA:  Palpitations. EXAM: CHEST - 2 VIEW COMPARISON:  Two-view chest x-ray 08/27/2018 FINDINGS: Heart size is normal. Lung volumes are low. There is no edema or effusion. No focal airspace  disease is present. IMPRESSION: 1. Low lung volumes. 2. No acute cardiopulmonary disease. Electronically Signed   By: Marin Roberts M.D.   On: 10/29/2018 19:25    Procedures Procedures (including critical care time)  Medications Ordered in ED Medications  sodium chloride flush (NS) 0.9 % injection 3 mL (has no administration in time range)     Initial Impression / Assessment and Plan / ED Course  I have reviewed the triage vital signs and the nursing notes.  Pertinent labs & imaging results that were available during my care of the patient were reviewed by me and considered in my medical decision making (see chart for details).        38 year old male with a history of HTN, hyperlipidemia, depression who is well-known to this emergency department with 11 visits over the last 6 months.  He had an episode that lasted for 3 to 4 minutes of shortness of breath, paresthesias in the hands and feet, headache, dizziness, lightheadedness, and headache after he began to feel palpitations in his chest.  In the ER, vital signs are reassuring.  EKG with normal sinus rhythm.  Troponin is negative.  Labs are reassuring although glucose is mildly elevated in the 140s.  Chest x-ray with low lung  volumes, but otherwise unremarkable.  He is established with cardiology.  He had a CT Angie of her dissection in December 2019, which was negative.  He had a CT of the coronary arteries and November 2019 with normal, patent arteries.  Low suspicion for esophageal rupture, ACS, pneumothorax, PE at this time.  I suspect the patient had a panic attack as he reports worsening anxiety and seems to have ruminations regarding his grandfather's passing during history taking.  He also mentions that he is googled side effects for medications that he is taking and for his grandfather's medical conditions.  His work-up today in the ER is reassuring.  I have advised him to follow-up with his cardiologist regarding palpitations and with his therapist regarding his anxiety and likely panic attacks.  Strict return precautions given.  He is hemodynamically stable and in no acute distress.  Safe for discharge home with outpatient follow-up this time.  Final Clinical Impressions(s) / ED Diagnoses   Final diagnoses:  Panic attack  Palpitations    ED Discharge Orders    None       Barkley Boards, PA-C 10/30/18 0329    Alvira Monday, MD 10/30/18 2323

## 2018-10-30 NOTE — Discharge Instructions (Signed)
Thank you for allowing me to care for you today in the Emergency Department. ]  Follow-up with Dr. Tresa Endo with cardiology if you continue to have palpitations.  One regimen that he may recommend after seeing you as a Holter monitor, but this will be up to him to decide if he feels that this is the appropriate intervention for you.  Please follow-up with your therapist regarding your worsening anxiety and panic attacks since you are not taking hydroxyzine.  The imaging of your coronary arteries and November showed that they were clear.  This is reassuring.  Continue to wear your CPAP mask at home when you sleep.  Return to the emergency department if you develop severe shortness of breath that does not improve with breathing exercises, chest pain, high fever, new numbness or weakness, if you pass out, or other new, concerning symptoms.

## 2018-11-02 ENCOUNTER — Other Ambulatory Visit: Payer: Self-pay

## 2018-11-02 MED ORDER — MECLIZINE HCL 25 MG PO TABS: 25.0000 mg | ORAL_TABLET | Freq: Three times a day (TID) | ORAL | 0 refills | Status: DC | PRN

## 2018-11-08 ENCOUNTER — Encounter: Payer: Self-pay | Admitting: Internal Medicine

## 2018-11-08 ENCOUNTER — Telehealth: Payer: Self-pay

## 2018-11-08 ENCOUNTER — Other Ambulatory Visit: Payer: Self-pay

## 2018-11-08 ENCOUNTER — Ambulatory Visit: Payer: Self-pay | Admitting: Internal Medicine

## 2018-11-08 VITALS — BP 100/70 | HR 60 | Resp 12 | Ht 64.0 in | Wt 236.0 lb

## 2018-11-08 DIAGNOSIS — R42 Dizziness and giddiness: Secondary | ICD-10-CM

## 2018-11-08 DIAGNOSIS — H6983 Other specified disorders of Eustachian tube, bilateral: Secondary | ICD-10-CM

## 2018-11-08 MED ORDER — FLUTICASONE PROPIONATE 50 MCG/ACT NA SUSP: 2.0000 | Freq: Every day | NASAL | 6 refills | Status: DC

## 2018-11-08 NOTE — Telephone Encounter (Signed)
Per Dr.Acharya,  Pt recent cardiac testing has been unremarkable. If he is doing well from a cardiac standpoint we can cancel his appt scheduled with Dr.Acharya for 11/09/18 and the pt can f/u prn. Unable to lmom, pt voicemail is full. Will attempt again.

## 2018-11-08 NOTE — Telephone Encounter (Signed)
2nd attempt to contact pt. Unable to lmom, pt voicemail is full.

## 2018-11-08 NOTE — Progress Notes (Signed)
    Subjective:    Patient ID: Samuel Austin, male   DOB: 03-16-1981, 38 y.o.   MRN: [id number]   HPI   1. Bipolar Disorder/anxiety/panic disorder/PTSD/?Schizophrenia:  States he has been taking his Citalopram and Risperdal regularly since last seen here 12.3.2019. Had panic attack for which he was seen in ED 10/29/2018 He goes to see his therapist once monthly at [facility] and Psychiatrist every 2 months.  Missed his last therapist appt.  Was too tired to go.    2.  ?Tourette's:  Has noted maybe mild improvement now he is taking Risperdal regularly.    3.  Pulsatile tinnitus.  Feels his heart beating in both ears for about 1 month.  Noted sort of vertigo or spinning with it.  Was standing and talking to someone when it started.  Lasted about 10 seconds.  No problems with hearing at the time.  Has had this occur about 4 times since with associated dizziness.  Again, lasted about 10 seconds.   Does not vacuum his carpet.  Does wash bedclothes weekly.   He is not using Flonase. He is already using Hydroxyzine.   Uses CPAP.   No mold or mildew in home.      Current Meds  Medication Sig  . acetaminophen (TYLENOL) 500 MG tablet Take 1 tablet (500 mg total) by mouth every 6 (six) hours as needed.  Marland Kitchen aspirin EC 81 MG tablet Take 1 tablet (81 mg total) by mouth daily.  . hydrOXYzine (ATARAX/VISTARIL) 25 MG tablet Take 1 tablet (25 mg total) by mouth every 8 (eight) hours as needed for up to 20 doses for anxiety.  Marland Kitchen lisinopril-hydrochlorothiazide (PRINZIDE,ZESTORETIC) 20-25 MG tablet Take 1 tablet by mouth daily.  . meclizine (ANTIVERT) 25 MG tablet Take 1 tablet (25 mg total) by mouth 3 (three) times daily as needed for dizziness.  . prazosin (MINIPRESS) 5 MG capsule Take 10 mg by mouth at bedtime.   Allergies  Allergen Reactions  . Motrin [Ibuprofen] Hives  . Lactose Intolerance (Gi) Diarrhea     Review of Systems    Objective:   BP 100/70 (BP Location: Left Arm, Patient  Position: Sitting, Cuff Size: Large)   Pulse 60   Resp 12   Ht 5\' 4"  (1.626 m)   Wt 236 lb (107 kg)   BMI 40.51 kg/m   Physical Exam  HEENT: PERRL, EOMI, conjunctivae without injection.  Left TM dull with fluid behind TM.  Nasal mucosa swollen and erythematous with clear discharge. Unable to see posterior pharynx well. Neck:  Supple, No adenopathy Chest:  CTA CV:  RRR without murmur or rub.  No carotid bruits.  Radial pulses normal and equal Neuro:  A & O x 3, CN II-XII grossly intact.  No nystagmus.     Assessment & Plan  1.  Allergies and left Eustachian tube dysfunction:  Not clear if this is causing his tinnitus.   He has chronic recurrent issues with what appears to be allergies and vertigo.   Fluticasone To keep dust down in home with regular vacuuming, dusting.  Changing and cleaning of bedclothes. Hypoallergenic mattress and pillow covers. Shoes to be left at door.

## 2018-11-08 NOTE — Patient Instructions (Signed)
Get out and be physically active every day.

## 2018-11-09 ENCOUNTER — Ambulatory Visit: Payer: Self-pay | Admitting: Internal Medicine

## 2018-11-09 NOTE — Telephone Encounter (Signed)
3rd attempt to contact the pt. Unable to lmom, pt voicemail is full.

## 2018-11-09 NOTE — Telephone Encounter (Signed)
Spoke with the pt. Pt is scheduled to see Dr.Achary today @ 1:20pm. Adv pt that we are contacting pt that are scheduled for routine f/u to she how they are doing and to reschedule their appts if indicated. Adv the pt that Dr.Acharya has reviewed his chart, if he is doing ok from a cardiac standpoint she can see him back prn. He is adv to keep his f/u with Dr.Kelly for his sleep treatment. Appt with Dr.Acharya cancelled.  Pt is agreeable with the plan. Confirmed his appt date and time with Dr.Kelly. pt sts that he may need to call back to reschedule his appt with Dr.Kelly for an afternoon slot. Provided the pt our office tel #.

## 2018-12-03 ENCOUNTER — Telehealth: Payer: Self-pay | Admitting: Cardiovascular Disease

## 2018-12-03 NOTE — Telephone Encounter (Signed)
Attempted to call pt to do visit precall and change to telephone visit. No answer and VM full. Will attempt to call again.

## 2018-12-06 ENCOUNTER — Telehealth: Payer: Self-pay | Admitting: Cardiovascular Disease

## 2018-12-06 NOTE — Telephone Encounter (Addendum)
Smartphone/  My chart pending/ consent to virtual/ pre reg completed

## 2018-12-07 ENCOUNTER — Encounter: Payer: Self-pay | Admitting: Cardiovascular Disease

## 2018-12-07 ENCOUNTER — Telehealth: Payer: Self-pay | Admitting: Cardiovascular Disease

## 2018-12-07 ENCOUNTER — Telehealth (INDEPENDENT_AMBULATORY_CARE_PROVIDER_SITE_OTHER): Payer: Self-pay | Admitting: Cardiovascular Disease

## 2018-12-07 VITALS — Ht 64.0 in

## 2018-12-07 DIAGNOSIS — R002 Palpitations: Secondary | ICD-10-CM

## 2018-12-07 DIAGNOSIS — G4733 Obstructive sleep apnea (adult) (pediatric): Secondary | ICD-10-CM

## 2018-12-07 DIAGNOSIS — E782 Mixed hyperlipidemia: Secondary | ICD-10-CM

## 2018-12-07 NOTE — Telephone Encounter (Signed)
New message   Patient states that he received a call at 7:55am. Patient states that he did not receive a call to do the virtual visit today at 9:00 am. Please call.

## 2018-12-07 NOTE — Telephone Encounter (Signed)
Patient spoke with Dr.Kelly- for his virtual visit.

## 2018-12-07 NOTE — Patient Instructions (Addendum)
Medication Instructions:  The current medical regimen is effective;  continue present plan and medications.  If you need a refill on your cardiac medications before your next appointment, please call your pharmacy.   Follow-Up: At [facility], you and your health needs are our priority.  As part of our continuing mission to provide you with exceptional heart care, we have created designated Provider Care Teams.  These Care Teams include your primary Cardiologist (physician) and Advanced Practice Providers (APPs -  Physician Assistants and Nurse Practitioners) who all work together to provide you with the care you need, when you need it. You will need a follow up appointment in 12 months.  Please call our office 2 months in advance to schedule this appointment.  You may see Dr.Kelly (sleep) or one of the following Advanced Practice Providers on your designated Care Team: Hao Meng, PA-C . Angela Duke, PA-C    

## 2018-12-07 NOTE — Progress Notes (Signed)
Virtual Visit via Video Note   This visit type was conducted due to national recommendations for restrictions regarding the COVID-19 Pandemic (e.g. social distancing) in an effort to limit this patient's exposure and mitigate transmission in our community.  Due to his co-morbid illnesses, this patient is at least at moderate risk for complications without adequate follow up.  This format is felt to be most appropriate for this patient at this time.  All issues noted in this document were discussed and addressed.  A limited physical exam was performed with this format.  Please refer to the patient's chart for his consent to telehealth for [facility].   Consent date December 03, 2018  Evaluation Performed:  Follow-up visit  This visit type was conducted due to national recommendations for restrictions regarding the COVID-19 Pandemic (e.g. social distancing).  This format is felt to be most appropriate for this patient at this time.  All issues noted in this document were discussed and addressed.  No physical exam was performed (except for noted visual exam findings with Video Visits).  Please refer to the patient's chart (MyChart message for video visits and phone note for telephone visits) for the patient's consent to telehealth for [facility]. Verbal consent was obtained with the patient to proceed with this visit and bill insurance.  Date:  12/07/2018   ID:  Samuel Austin, DOB 08-25-1981, MRN [id number]  Patient Location: [address] [place] [address]   Provider location:   [facility] [place]  PCP:  Julieanne Manson, MD  Cardiologist:  Dr. Jacques Navy Electrophysiologist:  None   Chief Complaint: New sleep evaluation  History of Present Illness:    Samuel Austin is a 38 y.o. male who presents via audio/video conferencing for a telehealth visit today.    The patient does not have symptoms concerning for COVID-19 infection (fever, chills,  cough, or new SHORTNESS OF BREATH).   Mr. Samuel Austin is a 38 year old African-American male who has a history of anxiety and depression, hypertension, hyperlipidemia, as well as palpitations.  There also was a family history for sleep apnea.  The patient is followed by Dr. Delrae Alfred for primary care and has seen Dr. Georga Hacking with atypical chest pain.  The patient has been on lisinopril HCT for hypertension, hydroxyzine for anxiety in addition to Risperdal and Celexa.  Due to concerns for obstructive sleep apnea with significant loud snoring, he was referred for a sleep study which was done on August 27, 2018.  This revealed severe sleep apnea with an AHI of 37.4, RDI of 79.4, and AHI with supine sleep at 72.9.  The patient was unable to achieve REM sleep in the diagnostic portion of the study.  CPAP was instituted and he was titrated up to 13 cm where AHI was 4.1.  CPAP auto therapy was recommended and he received a ResMed air sense 10 CPAP auto unit with pressure settings with a minimum of 13 up to a maximum of 20.  Since initiating CPAP therapy, he has noticed remarkable benefit.  He now has significantly more energy.  Previously he was having 3-4 episodes of nocturia per night and this is 0-1 time per night.  Previously he would wake up gasping for breath.  His frequent awakenings have resolved and he is now sleeping most of the night.  He typically sleeps 8 hours per night.  A download was obtained since CPAP initiation from March 15 through December 06, 2018.  He is 100%  compliant.  He is averaging 8 hours and 55 minutes of sleep per night.  AHI is excellent at 0.9 with his 95th percentile pressure at 14.8 and maximum pressure at 16.  He has noticed disappearance of prior snoring.  He now feels like he is getting into deeper sleep.  He is using a full facemask.  He states he has been getting supplies on [email or web address]  Upon further questions, he was concerned about his lipid status.  He states his primary  doctor had taken him off his lovastatin therapy.  He is concerned since laboratory in August 2019 was still elevated.  Total cholesterol was 222, triglycerides 265, HDL 40, VLDL 53, LDL cholesterol 129.  He was advised to improve diet exercise.  He has not had recent laboratory checked since.  Prior CV studies:   The following studies were reviewed today:  Notes from Dr. Jacques Navy Sleep study Download obtained  Past Medical History:  Diagnosis Date  . Depression   . Hyperchloremia   . Hypertension    No past surgical history on file.   Current Meds  Medication Sig  . acetaminophen (TYLENOL) 500 MG tablet Take 1 tablet (500 mg total) by mouth every 6 (six) hours as needed.  . citalopram (CELEXA) 10 MG tablet Take 10 mg by mouth daily. In morning.  . hydrOXYzine (ATARAX/VISTARIL) 25 MG tablet Take 1 tablet (25 mg total) by mouth every 8 (eight) hours as needed for up to 20 doses for anxiety.  Marland Kitchen lisinopril-hydrochlorothiazide (PRINZIDE,ZESTORETIC) 20-25 MG tablet Take 1 tablet by mouth daily.  . meclizine (ANTIVERT) 25 MG tablet Take 1 tablet (25 mg total) by mouth 3 (three) times daily as needed for dizziness.  . risperiDONE (RISPERDAL) 1 MG tablet Take 1 mg by mouth 2 (two) times daily.     Allergies:   Motrin [ibuprofen] and Lactose intolerance (gi)   Social History   Tobacco Use  . Smoking status: Current Some Day Smoker    Types: Cigars  . Smokeless tobacco: Never Used  Substance Use Topics  . Alcohol use: Yes    Comment: occ  . Drug use: Not Currently    Types: Marijuana     Family Hx: Family history is notable that his mother is 39 and and is on CPAP therapy.  Father is 53.  His maternal grandfather had heart disease.  ROS:   Please see the history of present illness.    Positive for morbid obesity.  Positive for hypertension, rare palpitations, severe sleep apnea.  No restless legs.  No bruxism.  Positive for anxiety and depression.  Positive for hyperlipidemia. All  other systems reviewed and are negative.   Labs/Other Tests and Data Reviewed:    Recent Labs: 08/07/2018: ALT 30 10/29/2018: BUN 12; Creatinine, Ser 1.21; Hemoglobin 16.1; Platelets 314; Potassium 3.6; Sodium 134   Recent Lipid Panel Lab Results  Component Value Date/Time   CHOL 222 (H) 04/01/2018 09:09 AM   TRIG 265 (H) 04/01/2018 09:09 AM   HDL 40 04/01/2018 09:09 AM   LDLCALC 129 (H) 04/01/2018 09:09 AM    Wt Readings from Last 3 Encounters:  11/08/18 236 lb (107 kg)  10/30/18 235 lb (106.6 kg)  08/31/18 235 lb (106.6 kg)     Exam:    Vital Signs:  Ht 5\' 4"  (1.626 m)   BMI 40.51 kg/m    He did not have a BP machine at his house to take his blood pressure.  He states his pulse is running  regular he denies any recent palpitations.  Well nourished, well developed male in no acute distress. He is morbidly obese and is 5 feet 4 but weighs 236 pounds. HEENT is notable for very thick neck.  Inspection of his mouth reveals a minimum of Mallampati scale of 3 as visualized from the video.  He has a thick tongue.  He did not have apparent JVD.  He is bearded.  Respirations were intact.  There was no audible wheezing.  He denied any chest discomfort to palpation of his chest.  He has central adiposity.  He did not have significant edema that was visualized.  He neurologically he appeared grossly normal. He had normal cognition and affect.  ASSESSMENT & PLAN:    1.  Severe obstructive sleep apnea: AHI 37.4, supine sleep AHI 72.9.  Unable to achieve REM cold on the arterial sleep on the diagnostic portion.  He has noticed a dramatic benefit since initiating therapy with resolution of his prior loud snoring, no longer waking up gasping for breath, his sleep is now restorative, nocturia has reduced from 3-4 times per night to less than 1 on average.  Occasionally still takes a daytime nap.  His palpitations have improved.  I had a long discussion with him and discussed the positive benefits  of CPAP therapy in particular with reference to his cardiovascular health.  We discussed normal sleep architecture.  We discussed adverse effects of sleep apnea on sleep architecture.  Previously he was unable to sleep on his back and his sleep study verified very severe sleep apnea in the supine position.  I commended him on his 100% compliance.  2.  Morbid obesity.  We discussed the importance of weight loss and exercise both with reference to his cardiovascular benefit as well as its beneficial effects with sleep apnea.  3.  Mixed hyperlipidemia: The patient is very concerned about strong history of cardiovascular disease particularly in his paternal grandfather.  He had been on lovastatin but this was discontinued.  Laboratory in August 2019 still showed significant lipid elevation.  I discussed the importance of improved diet, weight loss, avoidance of sweets and sugars.  I also feel repeat lipid testing should be undertaken and if his levels remain elevated reinitiation of lipid-lowering therapy should be performed particularly in this patient to reduce future cardiovascular risk.  I discussed that he should contact Dr. Delrae AlfredMulberry so that this can be initiated and followed up with.  4.  Palpitations: These have improved with CPAP use.  He was questioning if in the future he would need some type of evaluation since the nurse had mentioned to him in the past that if these occurred sounds like she recommended consideration for future cardiac monitoring.Marland Kitchen.  COVID-19 Education: The signs and symptoms of COVID-19 were discussed with the patient and how to seek care for testing (follow up with PCP or arrange E-visit).  The importance of social distancing was discussed today.  Patient Risk:   After full review of this patients clinical status, I feel that they are at least moderate risk at this time.  Time:   Today, I have spent 35 minutes with the patient with telehealth technology    Medication  Adjustments/Labs and Tests Ordered: Current medicines are reviewed at length with the patient today.  Concerns regarding medicines are outlined above.  Tests Ordered: No orders of the defined types were placed in this encounter.  Medication Changes: No orders of the defined types were placed in this encounter.  Disposition: 1 year for sleep evaluation or sooner if problems arise.  I have recommended follow-up of his lipid studies when he sees Dr. Delrae Alfred  Signed, Nicki Guadalajara, MD  12/07/2018 11:06 AM    [facility]

## 2018-12-08 NOTE — Progress Notes (Signed)
Cherice:  Please notify patient that he was not taking anything for cholesterol when he initiated care with [facility]. Not sure what happened, but he was to work on diet and physical activity after his elevated cholesterol in August and return for repeat FLP. Please share that with him and get him rescheduled for an FLP.

## 2018-12-10 NOTE — Progress Notes (Signed)
Called patient  No answer and mailbox is full

## 2018-12-14 NOTE — Progress Notes (Signed)
Spoke with patient last night regarding his cholesterol and left detailed message today for appointment date and time to get FLP done before next appointment in June.

## 2019-01-04 ENCOUNTER — Other Ambulatory Visit: Payer: Self-pay

## 2019-01-04 DIAGNOSIS — E785 Hyperlipidemia, unspecified: Secondary | ICD-10-CM

## 2019-01-05 LAB — LIPID PANEL W/O CHOL/HDL RATIO
Cholesterol, Total: 220 mg/dL — ABNORMAL HIGH (ref 100–199)
HDL: 40 mg/dL (ref >39)
LDL Calculated: 144 mg/dL — ABNORMAL HIGH (ref 0–99)
Triglycerides: 182 mg/dL — ABNORMAL HIGH (ref 0–149)
VLDL Cholesterol Cal: 36 mg/dL (ref 5–40)

## 2019-01-18 ENCOUNTER — Other Ambulatory Visit: Payer: Self-pay | Admitting: Internal Medicine

## 2019-01-18 MED ORDER — SIMVASTATIN 40 MG PO TABS: 40.0000 mg | ORAL_TABLET | Freq: Every day | ORAL | 11 refills | Status: DC

## 2019-01-18 NOTE — Progress Notes (Signed)
Spoke with patient. Informed I will be mailing out diet recommendations and to pick up Rx from [facility]. Patient verbalized understanding. Educated patient on staying away from greasy fried foods and trying to bake broil or grill food. Patient verbalized understanding and states he will pick up Rx today. Lab appointment scheduled for 03/01/2019

## 2019-01-18 NOTE — Progress Notes (Signed)
Send out diet for him to get to working on. Needs to knock off all the fried foods. Starting Simvastatin 40 mg daily, but this won't be enough if he isn't eating in a good way or being physically active. Needs FLP and hepatic profile in 6 weks.

## 2019-02-04 ENCOUNTER — Other Ambulatory Visit: Payer: Self-pay

## 2019-02-08 ENCOUNTER — Encounter: Payer: Self-pay | Admitting: Internal Medicine

## 2019-02-08 ENCOUNTER — Other Ambulatory Visit: Payer: Self-pay

## 2019-02-08 ENCOUNTER — Ambulatory Visit: Payer: Self-pay | Admitting: Internal Medicine

## 2019-02-08 VITALS — BP 130/80 | HR 72 | Resp 12 | Ht 64.0 in

## 2019-02-08 DIAGNOSIS — F439 Reaction to severe stress, unspecified: Secondary | ICD-10-CM

## 2019-02-08 DIAGNOSIS — F959 Tic disorder, unspecified: Secondary | ICD-10-CM

## 2019-02-08 DIAGNOSIS — M6289 Other specified disorders of muscle: Secondary | ICD-10-CM

## 2019-02-08 DIAGNOSIS — I10 Essential (primary) hypertension: Secondary | ICD-10-CM
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
## 2019-02-08 NOTE — Patient Instructions (Signed)
[facility]  Directions  Website Address: [address] [place], [place] [address]  Phone: [phone]   Need to work on trauma therapy with counselor

## 2019-03-01 ENCOUNTER — Other Ambulatory Visit: Payer: Self-pay

## 2019-03-07 ENCOUNTER — Other Ambulatory Visit (INDEPENDENT_AMBULATORY_CARE_PROVIDER_SITE_OTHER): Payer: Self-pay

## 2019-03-07 ENCOUNTER — Other Ambulatory Visit: Payer: Self-pay

## 2019-03-07 DIAGNOSIS — E785 Hyperlipidemia, unspecified: Secondary | ICD-10-CM

## 2019-03-07 DIAGNOSIS — Z79899 Other long term (current) drug therapy: Secondary | ICD-10-CM

## 2019-03-08 LAB — LIPID PANEL W/O CHOL/HDL RATIO
Cholesterol, Total: 154 mg/dL (ref 100–199)
HDL: 42 mg/dL (ref >39)
LDL Calculated: 81 mg/dL (ref 0–99)
Triglycerides: 156 mg/dL — ABNORMAL HIGH (ref 0–149)
VLDL Cholesterol Cal: 31 mg/dL (ref 5–40)

## 2019-03-08 LAB — HEPATIC FUNCTION PANEL
ALT: 30 IU/L (ref 0–44)
AST: 25 IU/L (ref 0–40)
Albumin: 4.6 g/dL (ref 4.0–5.0)
Alkaline Phosphatase: 67 IU/L (ref 39–117)
Bilirubin Total: 0.6 mg/dL (ref 0.0–1.2)
Bilirubin, Direct: 0.14 mg/dL (ref 0.00–0.40)
Total Protein: 7.2 g/dL (ref 6.0–8.5)

## 2019-04-02 ENCOUNTER — Encounter (HOSPITAL_COMMUNITY): Payer: Self-pay | Admitting: Emergency Medicine

## 2019-04-02 ENCOUNTER — Other Ambulatory Visit: Payer: Self-pay

## 2019-04-02 ENCOUNTER — Emergency Department (HOSPITAL_COMMUNITY)
Admission: EM | Admit: 2019-04-02 | Discharge: 2019-04-02 | Disposition: A | Discharge status: Home / Self Care | Payer: Self-pay | Attending: Emergency Medicine | Admitting: Emergency Medicine

## 2019-04-02 ENCOUNTER — Emergency Department (HOSPITAL_COMMUNITY): Payer: Self-pay

## 2019-04-02 DIAGNOSIS — E785 Hyperlipidemia, unspecified: Secondary | ICD-10-CM | POA: Insufficient documentation

## 2019-04-02 DIAGNOSIS — R51 Headache: Secondary | ICD-10-CM | POA: Insufficient documentation

## 2019-04-02 DIAGNOSIS — Z87891 Personal history of nicotine dependence: Secondary | ICD-10-CM | POA: Insufficient documentation

## 2019-04-02 DIAGNOSIS — H5789 Other specified disorders of eye and adnexa: Secondary | ICD-10-CM | POA: Insufficient documentation

## 2019-04-02 DIAGNOSIS — R0789 Other chest pain: Secondary | ICD-10-CM | POA: Insufficient documentation

## 2019-04-02 DIAGNOSIS — R079 Chest pain, unspecified: Secondary | ICD-10-CM

## 2019-04-02 DIAGNOSIS — Z7982 Long term (current) use of aspirin: Secondary | ICD-10-CM | POA: Insufficient documentation

## 2019-04-02 DIAGNOSIS — I10 Essential (primary) hypertension: Secondary | ICD-10-CM | POA: Insufficient documentation

## 2019-04-02 DIAGNOSIS — M546 Pain in thoracic spine: Secondary | ICD-10-CM | POA: Insufficient documentation

## 2019-04-02 DIAGNOSIS — Z886 Allergy status to analgesic agent status: Secondary | ICD-10-CM | POA: Insufficient documentation

## 2019-04-02 DIAGNOSIS — Z79899 Other long term (current) drug therapy: Secondary | ICD-10-CM | POA: Insufficient documentation

## 2019-04-02 DIAGNOSIS — R531 Weakness: Secondary | ICD-10-CM | POA: Insufficient documentation

## 2019-04-02 DIAGNOSIS — R110 Nausea: Secondary | ICD-10-CM | POA: Insufficient documentation

## 2019-04-02 DIAGNOSIS — E739 Lactose intolerance, unspecified: Secondary | ICD-10-CM | POA: Insufficient documentation

## 2019-04-02 DIAGNOSIS — K149 Disease of tongue, unspecified: Secondary | ICD-10-CM | POA: Insufficient documentation

## 2019-04-02 LAB — COMPREHENSIVE METABOLIC PANEL
ALT: 36 U/L (ref 0–44)
AST: 49 U/L — ABNORMAL HIGH (ref 15–41)
Albumin: 4.1 g/dL (ref 3.5–5.0)
Alkaline Phosphatase: 54 U/L (ref 38–126)
Anion gap: 10 (ref 5–15)
BUN: 14 mg/dL (ref 6–20)
CO2: 23 mmol/L (ref 22–32)
Calcium: 9.3 mg/dL (ref 8.9–10.3)
Chloride: 101 mmol/L (ref 98–111)
Creatinine, Ser: 1.17 mg/dL (ref 0.61–1.24)
GFR calc Af Amer: >60 mL/min (ref >60)
GFR calc non Af Amer: >60 mL/min (ref >60)
Glucose, Bld: 105 mg/dL — ABNORMAL HIGH (ref 70–99)
Potassium: 4.4 mmol/L (ref 3.5–5.1)
Sodium: 134 mmol/L — ABNORMAL LOW (ref 135–145)
Total Bilirubin: 1.4 mg/dL — ABNORMAL HIGH (ref 0.3–1.2)
Total Protein: 6.9 g/dL (ref 6.5–8.1)

## 2019-04-02 LAB — URINALYSIS, ROUTINE W REFLEX MICROSCOPIC
Bilirubin Urine: NEGATIVE
Glucose, UA: NEGATIVE mg/dL
Hgb urine dipstick: NEGATIVE
Ketones, ur: NEGATIVE mg/dL
Leukocytes,Ua: NEGATIVE
Nitrite: NEGATIVE
Protein, ur: NEGATIVE mg/dL
Specific Gravity, Urine: 1.003 — ABNORMAL LOW (ref 1.005–1.030)
pH: 7 (ref 5.0–8.0)

## 2019-04-02 LAB — CBC
HCT: 49.6 % (ref 39.0–52.0)
Hemoglobin: 16.6 g/dL (ref 13.0–17.0)
MCH: 30 pg (ref 26.0–34.0)
MCHC: 33.5 g/dL (ref 30.0–36.0)
MCV: 89.7 fL (ref 80.0–100.0)
Platelets: 293 10*3/uL (ref 150–400)
RBC: 5.53 MIL/uL (ref 4.22–5.81)
RDW: 12.2 % (ref 11.5–15.5)
WBC: 5.4 10*3/uL (ref 4.0–10.5)
nRBC: 0 % (ref 0.0–0.2)

## 2019-04-02 LAB — LIPASE, BLOOD: Lipase: 44 U/L (ref 11–51)

## 2019-04-02 MED ORDER — SODIUM CHLORIDE 0.9% FLUSH: 3.0000 mL | Freq: Once | INTRAVENOUS | Status: DC

## 2019-04-02 NOTE — ED Triage Notes (Signed)
Pt here for eval of bilateral lower back pain with weakness and nausea, states his tongue is discolored as well.

## 2019-04-02 NOTE — Discharge Instructions (Signed)
Today your chest x-ray, EKG, and blood work was generally reassuring.  I suspect that your symptoms may be worsened by your muscle spasm that you appear to have in your back and neck.    You may take Tylenol as needed for your pain.  Please perform gentle stretching and range of motion exercises along with walking for at least 20 minutes 3 times a week.  Please follow-up with your primary care doctor in the next week.  If you develop fevers, or have additional concerns or worsening symptoms please seek additional medical care and evaluation.

## 2019-04-02 NOTE — ED Notes (Signed)
Pt here with back pain and some chest discomfort for several days  No distress

## 2019-04-02 NOTE — ED Provider Notes (Signed)
[facility]   CSN: [id number] Arrival date & time: 04/02/19  1443    History   Chief Complaint Chief Complaint  Patient presents with   Back Pain   Nausea   Weakness    HPI Samuel BridgemanLance Alexander Alyson InglesWhidbee is a 38 y.o. male who presents today for evaluation of many complaints.   1.  Right sided chest and back pain: He reports that over the past 3 to 4 days he has had worsening pain in his right sided back and chest.  He denies any new activities.  He reports his pain is made worse with pressing on the area.  He denies any shortness of breath.  He denies any leg swelling or hemoptysis.  No recent surgeries or immobilizations.  He does not have a history of blood clots or coagulopathy.  He denies any fevers or known coronavirus contacts.  Chart review shows that he has recently been seen by cardiology, he had a coronary CT with a calcium score of 0 and normal coronary arteries.  In December 2019 he also had a CT abdomen, pelvis, and chest study performed for dissection which did not show any evidence of dissection or widened aorta.  2.  Headache: He reports that for approximately the past year he has had a band like tightness in his head with pain over his bilateral temples.  He denies any vision changes.  He reports that when he is more stressed his head hurts more, denies any alleviating factors.  He denies any recent trauma.  3.  Tongue discoloration: He reports that he noted that he had dark patches on his tongue this morning.  He denies any Pepto-Bismol use.  He states that he read on Wikipedia that a blue tongue can be a sign of low oxygen prompting his emergency room visit today as he is concerned he may have low oxygen.  4. Red eyes: He reports that over the past 3 weeks he has had worsening red eyes.  He reports that he has bad allergies that he is being treated for.  He denies any abnormal discharge.        HPI  Past Medical  History:  Diagnosis Date   Depression    Hyperchloremia    Hypertension     Patient Active Problem List   Diagnosis Date Noted   Tic disorder 08/21/2018   Dizziness 04/13/2018   Hypertension 03/11/2018   Hyperlipidemia 03/11/2018   Episodic mood disorder (HCC) 03/11/2018    No past surgical history on file.      Home Medications    Prior to Admission medications   Medication Sig Start Date End Date Taking? Authorizing Provider  acetaminophen (TYLENOL) 500 MG tablet Take 1 tablet (500 mg total) by mouth every 6 (six) hours as needed. 08/25/18   Garlon HatchetSanders, Lisa M, PA-C  aspirin EC 81 MG tablet Take 1 tablet (81 mg total) by mouth daily. Patient not taking: Reported on 12/07/2018 05/27/18   Julieanne MansonMulberry, Laney Bagshaw, MD  citalopram (CELEXA) 10 MG tablet Take 10 mg by mouth daily. In morning.    [provider]  fluticasone (FLONASE) 50 MCG/ACT nasal spray Place 2 sprays into both nostrils daily. Patient not taking: Reported on 12/07/2018 11/08/18   Julieanne MansonMulberry, Shawnetta Lein, MD  hydrOXYzine (ATARAX/VISTARIL) 25 MG tablet Take 1 tablet (25 mg total) by mouth every 8 (eight) hours as needed for up to 20 doses for anxiety. 08/16/18   Julieanne MansonMulberry, Thadius Smisek, MD  lisinopril-hydrochlorothiazide Marcell Anger(PRINZIDE,ZESTORETIC)  20-25 MG tablet Take 1 tablet by mouth daily. 04/13/18   Mack Hook, MD  meclizine (ANTIVERT) 25 MG tablet Take 1 tablet (25 mg total) by mouth 3 (three) times daily as needed for dizziness. Patient not taking: Reported on 02/08/2019 11/02/18   Mack Hook, MD  prazosin (MINIPRESS) 5 MG capsule Take 10 mg by mouth at bedtime.    [provider]  risperiDONE (RISPERDAL) 1 MG tablet Take 1 mg by mouth 2 (two) times daily.    [provider]  simvastatin (ZOCOR) 40 MG tablet Take 1 tablet (40 mg total) by mouth at bedtime. 01/18/19   Mack Hook, MD    Family History Family History  Problem Relation Age of Onset   Drug abuse Mother         crack   Bipolar disorder Mother    Heart disease Mother        Carotid artery disease   Alcohol abuse Father     Social History Social History   Tobacco Use   Smoking status: Former Smoker    Types: Cigars   Smokeless tobacco: Never Used  Substance Use Topics   Alcohol use: Yes    Comment: occ   Drug use: Not Currently    Types: Marijuana     Allergies   Motrin [ibuprofen] and Lactose intolerance (gi)   Review of Systems Review of Systems  Constitutional: Negative for chills, fatigue and fever.  HENT:       Tongue discoloration  Eyes: Positive for redness.  Respiratory: Negative for cough, chest tightness and shortness of breath.   Cardiovascular: Positive for chest pain. Negative for palpitations and leg swelling.  Gastrointestinal: Negative for diarrhea, nausea, rectal pain and vomiting.  Genitourinary: Negative for dysuria.  Musculoskeletal: Positive for back pain. Negative for neck pain.  Skin: Negative for color change and rash.  Neurological: Positive for headaches. Negative for weakness and light-headedness.  All other systems reviewed and are negative.    Physical Exam Updated Vital Signs BP 128/74    Pulse 84    Temp 98.7 F (37.1 C)    Resp 18    SpO2 96%   Physical Exam Vitals signs and nursing note reviewed.  Constitutional:      General: He is not in acute distress.    Appearance: He is well-developed. He is not toxic-appearing.  HENT:     Head: Normocephalic and atraumatic.     Comments: Tenderness to palpation over TMJ joints and temporalis muscle bilaterally.  Palpation here both re-creates and exacerbates his reported headache.    Nose: Nose normal.     Mouth/Throat:     Comments: There are 2 approximately 1 cm irregular areas of darkness on the tongue.  No obvious ulcers. Eyes:     General:        Right eye: No discharge.        Left eye: No discharge.     Pupils: Pupils are equal, round, and reactive to light.     Comments:  Mild erythema on the conjunctivo-bilaterally without abnormal drainage/discharge.  Neck:     Musculoskeletal: Normal range of motion and neck supple. No neck rigidity.  Cardiovascular:     Rate and Rhythm: Normal rate and regular rhythm.     Pulses: Normal pulses.     Heart sounds: Normal heart sounds. No murmur.  Pulmonary:     Effort: Pulmonary effort is normal. No respiratory distress.     Breath sounds: Normal breath sounds.  Chest:     Chest wall: Tenderness present. No mass, lacerations, deformity or crepitus.     Comments: Palpation over the right thoracic paraspinal muscles both re-creates and exacerbates his reported back and chest pain.   Abdominal:     Palpations: Abdomen is soft.     Tenderness: There is no abdominal tenderness.  Musculoskeletal:     Right lower leg: No edema.     Left lower leg: No edema.     Comments: C/T/L-spine palpated without midline tenderness to palpation, step-offs, or deformities.  Paraspinal muscles primarily in the C/T-spine, specifically the trapezius muscle are very tight consistent with spasm.  Palpation here both re-creates and exacerbates his reported pain and headache.  Skin:    General: Skin is warm and dry.  Neurological:     General: No focal deficit present.     Mental Status: He is alert and oriented to person, place, and time.     Cranial Nerves: No cranial nerve deficit.  Psychiatric:        Behavior: Behavior normal.     Comments: Anxious      ED Treatments / Results  Labs (all labs ordered are listed, but only abnormal results are displayed) Labs Reviewed  COMPREHENSIVE METABOLIC PANEL - Abnormal; Notable for the following components:      Result Value   Sodium 134 (*)    Glucose, Bld 105 (*)    AST 49 (*)    Total Bilirubin 1.4 (*)    All other components within normal limits  URINALYSIS, ROUTINE W REFLEX MICROSCOPIC - Abnormal; Notable for the following components:   Color, Urine COLORLESS (*)    Specific Gravity,  Urine 1.003 (*)    All other components within normal limits  LIPASE, BLOOD  CBC    EKG EKG Interpretation  Date/Time:  Saturday April 02 2019 14:53:14 EDT Ventricular Rate:  96 PR Interval:  158 QRS Duration: 84 QT Interval:  366 QTC Calculation: 462 R Axis:   70 Text Interpretation:  Normal sinus rhythm Normal ECG Confirmed by Margarita Grizzleay, Danielle [phone]([id number]) on 04/02/2019 7:04:47 PM   Radiology Dg Chest Port 1 View  Result Date: 04/02/2019 CLINICAL DATA:  Upper back pain. EXAM: PORTABLE CHEST 1 VIEW COMPARISON:  October 29, 2018 FINDINGS: The cardiomediastinal silhouette is stable. No pneumothorax. No nodules or masses. No focal infiltrates. IMPRESSION: No active disease. Electronically Signed   By: Gerome Samavid  Williams III M.D   On: 04/02/2019 18:28    Procedures Procedures (including critical care time)  Medications Ordered in ED Medications  sodium chloride flush (NS) 0.9 % injection 3 mL (has no administration in time range)     Initial Impression / Assessment and Plan / ED Course  I have reviewed the triage vital signs and the nursing notes.  Pertinent labs & imaging results that were available during my care of the patient were reviewed by me and considered in my medical decision making (see chart for details).       Patient presents today for evaluation of multiple complaints. 1 right-sided chest pain: He has had prior cardiac work-up showing normal coronary arteries without significant calcium.  He is PERC negative.  He is a normal chest x-ray today without evidence of pneumothorax, consolidation, or other cause for his symptoms.  His EKG is without evidence of ischemia.  Do not suspect ACS.  He had a dissection study in December that did not show evidence of abnormal widening of the aorta and his mediastinum is  not significantly enlarged on chest x-ray today, do not suspect dissection.  I am able to worsen his pain with palpation of the right-sided thoracic back and chest wall.   This both re-creates and exacerbates his reported pain.  Suspect musculoskeletal pain.  Recommended PCP follow-up in addition to stretching, gentle range of motion.   2, Headache: Patient reports headache for approximately 1 year.  On exam I am able to re-create and exacerbate his headache with palpation over the temporalis muscles and TMJ joints bilaterally.  He is neurologically intact on exam.  His headache is also worsened with palpation of cervical paraspinal muscles.  And he describes this as a bandlike headache.  I suspect tension headache.  Recommended PCP follow-up, in addition to mindfulness based stress reduction, stretching and range of motion exercises and conservative care.   3. Red eyes: Mild, bilateral.  He denies marijuana use however chart review reports that he has reported this in the past.  Advised that his red eyes may be a result of marijuana use, however I also suspect his allergies may be contributing.  No evidence of secondary bacterial infection.  Recommended conservative care and primary care follow-up.  No pain or foreign body sensation.  4. Tongue discoloration: There are 2 irregular shaped small areas of darkness on his tongue.  He is satting at 96% on room air.  Given absence of other similar rashes recommended PCP and dentist follow-up.  He has not taken any Pepto-Bismol recently.  Return precautions were discussed with patient who states their understanding.  At the time of discharge patient denied any unaddressed complaints or concerns.  Patient is agreeable for discharge home.    Final Clinical Impressions(s) / ED Diagnoses   Final diagnoses:  Right-sided chest pain  Atypical chest pain    ED Discharge Orders    None       Norman ClayHammond, Dorthula Bier W, PA-C 04/02/19 [id number] [id number]    Margarita Grizzleay, Danielle, MD 04/02/19 [phone]

## 2019-04-18 ENCOUNTER — Other Ambulatory Visit: Payer: Self-pay | Admitting: Internal Medicine

## 2019-04-18 ENCOUNTER — Other Ambulatory Visit: Payer: Self-pay

## 2019-05-11 ENCOUNTER — Encounter: Payer: Self-pay | Admitting: Internal Medicine

## 2019-05-11 ENCOUNTER — Ambulatory Visit: Payer: Self-pay | Admitting: Internal Medicine

## 2019-05-11 ENCOUNTER — Other Ambulatory Visit: Payer: Self-pay

## 2019-05-11 VITALS — BP 102/72 | HR 78 | Resp 12 | Ht 64.0 in | Wt 259.0 lb

## 2019-05-11 DIAGNOSIS — F959 Tic disorder, unspecified: Secondary | ICD-10-CM

## 2019-05-11 DIAGNOSIS — F439 Reaction to severe stress, unspecified: Secondary | ICD-10-CM

## 2019-05-11 DIAGNOSIS — Z6841 Body Mass Index (BMI) 40.0 and over, adult: Secondary | ICD-10-CM

## 2019-05-11 DIAGNOSIS — K603 Anal fistula: Secondary | ICD-10-CM

## 2019-05-11 DIAGNOSIS — R0981 Nasal congestion: Secondary | ICD-10-CM

## 2019-05-11 MED ORDER — CIPROFLOXACIN HCL 500 MG PO TABS: 500.0000 mg | ORAL_TABLET | Freq: Two times a day (BID) | ORAL | 0 refills | Status: DC

## 2019-05-11 MED ORDER — METRONIDAZOLE 500 MG PO TABS: ORAL_TABLET | ORAL | 0 refills | Status: DC
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
## 2019-05-11 NOTE — Patient Instructions (Signed)
Warm bath twice daily 30 minutes after taking antibiotics

## 2019-05-13 ENCOUNTER — Other Ambulatory Visit: Payer: Self-pay

## 2019-05-13 ENCOUNTER — Encounter (HOSPITAL_COMMUNITY): Payer: Self-pay

## 2019-05-13 ENCOUNTER — Telehealth: Payer: Self-pay

## 2019-05-13 ENCOUNTER — Emergency Department (HOSPITAL_COMMUNITY)
Admission: EM | Admit: 2019-05-13 | Discharge: 2019-05-13 | Disposition: A | Discharge status: Home / Self Care | Payer: Self-pay | Attending: Emergency Medicine | Admitting: Emergency Medicine

## 2019-05-13 DIAGNOSIS — Z79899 Other long term (current) drug therapy: Secondary | ICD-10-CM | POA: Insufficient documentation

## 2019-05-13 DIAGNOSIS — K4090 Unilateral inguinal hernia, without obstruction or gangrene, not specified as recurrent: Secondary | ICD-10-CM

## 2019-05-13 DIAGNOSIS — Z87891 Personal history of nicotine dependence: Secondary | ICD-10-CM | POA: Insufficient documentation

## 2019-05-13 DIAGNOSIS — I10 Essential (primary) hypertension: Secondary | ICD-10-CM | POA: Insufficient documentation

## 2019-05-13 DIAGNOSIS — K4091 Unilateral inguinal hernia, without obstruction or gangrene, recurrent: Secondary | ICD-10-CM | POA: Insufficient documentation

## 2019-05-13 DIAGNOSIS — Z7982 Long term (current) use of aspirin: Secondary | ICD-10-CM | POA: Insufficient documentation

## 2019-05-13 MED ORDER — HYDROCODONE-ACETAMINOPHEN 5-325 MG PO TABS: 2.0000 | ORAL_TABLET | ORAL | 0 refills | Status: DC | PRN

## 2019-05-13 MED ORDER — GABAPENTIN 100 MG PO CAPS: 100.0000 mg | ORAL_CAPSULE | Freq: Three times a day (TID) | ORAL | 0 refills | Status: DC | PRN

## 2019-05-13 MED ORDER — HYDROCODONE-ACETAMINOPHEN 5-325 MG PO TABS: 1.0000 | ORAL_TABLET | Freq: Four times a day (QID) | ORAL | 0 refills | Status: DC | PRN

## 2019-05-13 NOTE — Telephone Encounter (Signed)
Patient called stating he was told to call if he feels the need to go to the ER. States he has a hernia in his left groin and it has really become painful over the last 2 weeks. States last night he did not get any sleep because of the pain. Patient wants to know if he should go to ER or what to do because he can no longer take the pain.  To Dr. Amil Amen for further directions.

## 2019-05-13 NOTE — ED Triage Notes (Signed)
Pt states he is having hernia pain 2 nights ago. Pt states he's had it for a year. Pain is in groin. Pt describes the pain as constant.

## 2019-05-13 NOTE — ED Notes (Signed)
Visitor at bedside.

## 2019-05-13 NOTE — ED Provider Notes (Signed)
[facility]-EMERGENCY DEPT Provider Note   CSN: [id number] Arrival date & time: 05/13/19  1617     History   Chief Complaint Chief Complaint  Patient presents with  . Hernia    HPI Samuel Austin is a 38 y.o. male.     HPI Patient with left inguinal hernia for several years.  He states over the last 2 days it is become more symptomatic.  States that the hernia is reducible.  Denies obstipation or constipation.  No nausea or vomiting.  Patient also complains of a burning-like sensation around the skin of the groin.  No testicular swelling or pain.  No urinary symptoms. Past Medical History:  Diagnosis Date  . Depression   . Hyperchloremia   . Hypertension     Patient Active Problem List   Diagnosis Date Noted  . Tic disorder 08/21/2018  . Dizziness 04/13/2018  . Hypertension 03/11/2018  . Hyperlipidemia 03/11/2018  . Episodic mood disorder (HCC) 03/11/2018    History reviewed. No pertinent surgical history.      Home Medications    Prior to Admission medications   Medication Sig Start Date End Date Taking? Authorizing Provider  ciprofloxacin (CIPRO) 500 MG tablet Take 1 tablet (500 mg total) by mouth 2 (two) times daily. 05/11/19  Yes Julieanne Manson, MD  citalopram (CELEXA) 10 MG tablet Take 10 mg by mouth daily. In morning.   Yes [provider]  hydrOXYzine (ATARAX/VISTARIL) 25 MG tablet Take 1 tablet (25 mg total) by mouth every 8 (eight) hours as needed for up to 20 doses for anxiety. 08/16/18  Yes Julieanne Manson, MD  ibuprofen (ADVIL) 200 MG tablet Take 200 mg by mouth every 6 (six) hours as needed for moderate pain.   Yes [provider]  lisinopril-hydrochlorothiazide (ZESTORETIC) 20-25 MG tablet Take 1 tablet by mouth once daily 04/18/19  Yes Julieanne Manson, MD  metroNIDAZOLE (FLAGYL) 500 MG tablet 1 tab by mouth 3 times daily for 10 days 05/11/19  Yes Julieanne Manson, MD  risperiDONE  (RISPERDAL) 1 MG tablet Take 1 mg by mouth 2 (two) times daily.   Yes [provider]  simvastatin (ZOCOR) 40 MG tablet Take 1 tablet (40 mg total) by mouth at bedtime. 01/18/19  Yes Julieanne Manson, MD  aspirin EC 81 MG tablet Take 1 tablet (81 mg total) by mouth daily. Patient not taking: Reported on 12/07/2018 05/27/18   Julieanne Manson, MD  gabapentin (NEURONTIN) 100 MG capsule Take 1 capsule (100 mg total) by mouth 3 (three) times daily as needed (burning pain). 05/13/19   Loren Racer, MD  HYDROcodone-acetaminophen (NORCO) 5-325 MG tablet Take 1 tablet by mouth every 6 (six) hours as needed for severe pain. 05/13/19   Loren Racer, MD    Family History Family History  Problem Relation Age of Onset  . Drug abuse Mother        crack  . Bipolar disorder Mother   . Heart disease Mother        Carotid artery disease  . Alcohol abuse Father     Social History Social History   Tobacco Use  . Smoking status: Former Smoker    Types: Cigars  . Smokeless tobacco: Never Used  Substance Use Topics  . Alcohol use: Yes    Comment: occ  . Drug use: Not Currently    Types: Marijuana     Allergies   Motrin [ibuprofen] and Lactose intolerance (gi)   Review of Systems Review of Systems  Constitutional: Negative for chills, fatigue and fever.  Respiratory: Negative for cough and shortness of breath.   Cardiovascular: Negative for chest pain.  Gastrointestinal: Negative for abdominal pain, diarrhea, nausea and vomiting.  Genitourinary: Positive for scrotal swelling. Negative for discharge, dysuria, flank pain, hematuria, penile swelling and testicular pain.  Musculoskeletal: Negative for back pain and neck pain.  Skin: Negative for rash and wound.  Neurological: Negative for dizziness, weakness, light-headedness, numbness and headaches.  All other systems reviewed and are negative.    Physical Exam Updated Vital Signs BP 127/83   Pulse 74   Temp 98.2 F  (36.8 C) (Oral)   Resp 16   Wt 118 kg   SpO2 97%   BMI 44.65 kg/m   Physical Exam Vitals signs and nursing note reviewed.  Constitutional:      Appearance: Normal appearance. He is well-developed.  HENT:     Head: Normocephalic and atraumatic.     Nose: Nose normal.     Mouth/Throat:     Mouth: Mucous membranes are moist.  Eyes:     Pupils: Pupils are equal, round, and reactive to light.  Neck:     Musculoskeletal: Normal range of motion and neck supple.  Cardiovascular:     Rate and Rhythm: Normal rate and regular rhythm.  Pulmonary:     Effort: Pulmonary effort is normal.     Breath sounds: Normal breath sounds.  Abdominal:     General: Bowel sounds are normal. There is no distension.     Palpations: Abdomen is soft.     Tenderness: There is no abdominal tenderness. There is no right CVA tenderness, left CVA tenderness, guarding or rebound.  Genitourinary:    Comments: Patient with left inguinal hernia which is easily reduced.  No testicular swelling or tenderness.  No rashes appreciated. Musculoskeletal: Normal range of motion.        General: No swelling, tenderness, deformity or signs of injury.     Right lower leg: No edema.     Left lower leg: No edema.     Comments: No midline thoracic or lumbar tenderness.  No CVA tenderness.  Skin:    General: Skin is warm and dry.     Findings: No erythema or rash.  Neurological:     Mental Status: He is alert and oriented to person, place, and time.  Psychiatric:        Behavior: Behavior normal.      ED Treatments / Results  Labs (all labs ordered are listed, but only abnormal results are displayed) Labs Reviewed - No data to display  EKG None  Radiology No results found.  Procedures Procedures (including critical care time)  Medications Ordered in ED Medications - No data to display   Initial Impression / Assessment and Plan / ED Course  I have reviewed the triage vital signs and the nursing notes.   Pertinent labs & imaging results that were available during my care of the patient were reviewed by me and considered in my medical decision making (see chart for details).        Patient with reducible inguinal hernia.  Advised to wear supportive undergarments, abstain from heavy lifting and use stool softener as needed.  Patient also has been given [facility].  I am unsure as to the cause of the burning sensation of the skin.  This sounds like possible neuropathic pain.  No rashes appreciated.  In addition to pain medication for the hernia  will start on gabapentin as well.  Strict return precautions have been given.  Final Clinical Impressions(s) / ED Diagnoses   Final diagnoses:  Left inguinal hernia    ED Discharge Orders         Ordered    HYDROcodone-acetaminophen (NORCO) 5-325 MG tablet  Every 4 hours PRN,   Status:  Discontinued     05/13/19 1938    gabapentin (NEURONTIN) 100 MG capsule  3 times daily PRN     05/13/19 1938    HYDROcodone-acetaminophen (NORCO) 5-325 MG tablet  Every 6 hours PRN     05/13/19 1939           Julianne Rice, MD 05/13/19 1951

## 2019-05-17 ENCOUNTER — Other Ambulatory Visit: Payer: Self-pay | Admitting: Licensed Clinical Social Worker

## 2019-05-17 NOTE — Telephone Encounter (Signed)
Had left detailed message for patient on 05/13/2019 that we would call this week to get him in for appointment. Our system went down on Friday afternoon was unable to document call. But patient looks like went to ER anyhow.

## 2019-05-18 NOTE — Telephone Encounter (Signed)
He did go to the ED and appears was reducible. He has been given a surgical referral and started on pain medication.

## 2019-05-24 ENCOUNTER — Other Ambulatory Visit: Payer: Self-pay | Admitting: Surgery

## 2019-05-24 ENCOUNTER — Telehealth: Payer: Self-pay | Admitting: Internal Medicine

## 2019-05-24 NOTE — Telephone Encounter (Signed)
Patient requesting speak with Cherice regarding some concerns patient has before surgery.  To Cherice to contact patient.

## 2019-05-24 NOTE — Telephone Encounter (Signed)
Spoke with patient. Appointment scheduled for tomorrow

## 2019-05-25 ENCOUNTER — Encounter: Payer: Self-pay | Admitting: Internal Medicine

## 2019-05-25 ENCOUNTER — Other Ambulatory Visit: Payer: Self-pay

## 2019-05-25 ENCOUNTER — Ambulatory Visit (INDEPENDENT_AMBULATORY_CARE_PROVIDER_SITE_OTHER): Payer: Self-pay | Admitting: Internal Medicine

## 2019-05-25 VITALS — BP 118/78 | HR 68 | Resp 12 | Ht 64.0 in | Wt 254.0 lb

## 2019-05-25 DIAGNOSIS — K603 Anal fistula: Secondary | ICD-10-CM

## 2019-05-25 NOTE — Progress Notes (Signed)
    Subjective:    Patient ID: Samuel Austin, male   DOB: Apr 23, 1981, 38 y.o.   MRN: [id number]   HPI   1.  Perianal fistula:  Finished his Metronidazole and Cipro this past Saturday after 10 day course.  Felt a bit better, but when wiped yesterday, started with bloody drainage again.  States he did perform sitz bath.   Patient called concerned yesterday as has some blood on tissue when used bathroom Gives history today that he has had issues with recurrent swellings/abscesses and then perianal fistulas for several years.     Current Meds  Medication Sig  . citalopram (CELEXA) 10 MG tablet Take 10 mg by mouth daily. In morning.  . gabapentin (NEURONTIN) 100 MG capsule Take 1 capsule (100 mg total) by mouth 3 (three) times daily as needed (burning pain).  . hydrOXYzine (ATARAX/VISTARIL) 25 MG tablet Take 1 tablet (25 mg total) by mouth every 8 (eight) hours as needed for up to 20 doses for anxiety.  Marland Kitchen lisinopril-hydrochlorothiazide (ZESTORETIC) 20-25 MG tablet Take 1 tablet by mouth once daily  . risperiDONE (RISPERDAL) 1 MG tablet Take 1 mg by mouth 2 (two) times daily.  . simvastatin (ZOCOR) 40 MG tablet Take 1 tablet (40 mg total) by mouth at bedtime.   Allergies  Allergen Reactions  . Motrin [Ibuprofen] Hives  . Lactose Intolerance (Gi) Diarrhea     Review of Systems    Objective:   BP 118/78 (BP Location: Left Arm, Patient Position: Sitting, Cuff Size: Large)   Pulse 68   Resp 12   Ht 5\' 4"  (1.626 m)   Wt 254 lb (115.2 kg)   BMI 43.60 kg/m   Physical Exam Right 2-3 mm fistula opening without surrounding erythema and no discharge.  No abscess.  NT  Assessment & Plan   1.  Recurrent Perianal fistula:  Discussed gentle cleansing  Note to surgery--can they evaluate for surgery?  2.  Right inguinal hernia:  Dr. Ninfa Linden planning for surgery

## 2019-05-25 NOTE — Patient Instructions (Signed)
Sitz bath with gentle cleansing every night. No wiping--use non alcohol containing wipes to pat clean Call if pustular discharge or swelling/pain.

## 2019-06-06 ENCOUNTER — Ambulatory Visit: Payer: Self-pay | Admitting: Internal Medicine

## 2019-06-07 ENCOUNTER — Encounter (HOSPITAL_COMMUNITY): Payer: Self-pay | Admitting: Emergency Medicine

## 2019-06-07 ENCOUNTER — Emergency Department (HOSPITAL_COMMUNITY): Payer: Self-pay

## 2019-06-07 ENCOUNTER — Emergency Department (HOSPITAL_COMMUNITY)
Admission: EM | Admit: 2019-06-07 | Discharge: 2019-06-08 | Discharge status: Against Medical Advice | Payer: Self-pay | Attending: Emergency Medicine | Admitting: Emergency Medicine

## 2019-06-07 DIAGNOSIS — Z5321 Procedure and treatment not carried out due to patient leaving prior to being seen by health care provider: Secondary | ICD-10-CM | POA: Insufficient documentation

## 2019-06-07 LAB — COMPREHENSIVE METABOLIC PANEL
ALT: 35 U/L (ref 0–44)
AST: 39 U/L (ref 15–41)
Albumin: 4.1 g/dL (ref 3.5–5.0)
Alkaline Phosphatase: 49 U/L (ref 38–126)
Anion gap: 12 (ref 5–15)
BUN: 14 mg/dL (ref 6–20)
CO2: 22 mmol/L (ref 22–32)
Calcium: 9.4 mg/dL (ref 8.9–10.3)
Chloride: 103 mmol/L (ref 98–111)
Creatinine, Ser: 1.41 mg/dL — ABNORMAL HIGH (ref 0.61–1.24)
GFR calc Af Amer: >60 mL/min (ref >60)
GFR calc non Af Amer: >60 mL/min (ref >60)
Glucose, Bld: 103 mg/dL — ABNORMAL HIGH (ref 70–99)
Potassium: 3.9 mmol/L (ref 3.5–5.1)
Sodium: 137 mmol/L (ref 135–145)
Total Bilirubin: 1.2 mg/dL (ref 0.3–1.2)
Total Protein: 7.2 g/dL (ref 6.5–8.1)

## 2019-06-07 LAB — CBC WITH DIFFERENTIAL/PLATELET
Abs Immature Granulocytes: 0.01 10*3/uL (ref 0.00–0.07)
Basophils Absolute: 0 10*3/uL (ref 0.0–0.1)
Basophils Relative: 1 %
Eosinophils Absolute: 0.1 10*3/uL (ref 0.0–0.5)
Eosinophils Relative: 3 %
HCT: 50 % (ref 39.0–52.0)
Hemoglobin: 16.3 g/dL (ref 13.0–17.0)
Immature Granulocytes: 0 %
Lymphocytes Relative: 43 %
Lymphs Abs: 1.9 10*3/uL (ref 0.7–4.0)
MCH: 30 pg (ref 26.0–34.0)
MCHC: 32.6 g/dL (ref 30.0–36.0)
MCV: 92.1 fL (ref 80.0–100.0)
Monocytes Absolute: 0.4 10*3/uL (ref 0.1–1.0)
Monocytes Relative: 10 %
Neutro Abs: 1.8 10*3/uL (ref 1.7–7.7)
Neutrophils Relative %: 43 %
Platelets: 311 10*3/uL (ref 150–400)
RBC: 5.43 MIL/uL (ref 4.22–5.81)
RDW: 12.2 % (ref 11.5–15.5)
WBC: 4.3 10*3/uL (ref 4.0–10.5)
nRBC: 0 % (ref 0.0–0.2)

## 2019-06-07 NOTE — ED Triage Notes (Signed)
Pt in with c/o sob, chills and bilateral hand tingling that began 1 hr PTA. States he has anal fistula and has been having increased pain to area x few days. Is anxious in triage, c/o nausea, denies cough or cp. Afebrile, VSS

## 2019-06-14 ENCOUNTER — Other Ambulatory Visit (HOSPITAL_COMMUNITY)
Admission: RE | Admit: 2019-06-14 | Discharge: 2019-06-14 | Disposition: A | Discharge status: Home / Self Care | Payer: Self-pay | Source: Ambulatory Visit | Attending: Surgery | Admitting: Surgery

## 2019-06-14 DIAGNOSIS — Z20828 Contact with and (suspected) exposure to other viral communicable diseases: Secondary | ICD-10-CM | POA: Insufficient documentation

## 2019-06-14 DIAGNOSIS — Z01812 Encounter for preprocedural laboratory examination: Secondary | ICD-10-CM | POA: Insufficient documentation

## 2019-06-15 ENCOUNTER — Encounter (HOSPITAL_COMMUNITY): Payer: Self-pay | Admitting: *Deleted

## 2019-06-15 ENCOUNTER — Other Ambulatory Visit: Payer: Self-pay

## 2019-06-15 LAB — NOVEL CORONAVIRUS, NAA (HOSP ORDER, SEND-OUT TO REF LAB; TAT 18-24 HRS): SARS-CoV-2, NAA: NOT DETECTED

## 2019-06-15 NOTE — Progress Notes (Signed)
Patient denies shortness of breath, fever, cough and chest pain.  PCP - Dr Mack Hook Cardiologist - Dr Shelva Majestic  Chest x-ray - 06/07/19 EKG - 04/02/19 Stress Test - Denies ECHO - 06/30/18 Cardiac Cath - Denies  Sleep Study - Yes CPAP - Uses CPAP nightly  STOP now taking any Aspirin (unless otherwise instructed by your surgeon), Aleve, Naproxen, Ibuprofen, Motrin, Advil, Goody's, BC's, all herbal medications, fish oil, and all vitamins.   Coronavirus Screening Covid test on 06/14/19 was negative.  Patient verbalized understanding of instructions that were given via phone.

## 2019-06-16 NOTE — H&P (Signed)
ONYEKACHI GATHRIGHT Documented: 05/24/2019 3:54 PM Location: [facility] Patient #: [id number] DOB: March 05, 1981 Single / Language: [place] / Race: Black or African American Male   History of Present Illness (Kamron Vanwyhe A. Magnus Ivan MD; 05/24/2019 4:08 PM) The patient is a 38 year old male who presents with an inguinal hernia. This gentleman is referred by the emergency department for left inguinal hernia. He reports he has had a hernia for several years but recently it has become much larger and much more difficult to reduce. He has had pain but no nausea and vomiting. He also reports he has had a small right inguinal hernia seen on x-ray imaging but is asymptomatic from that. He is otherwise without complaint. Again, the pain is moderate to severe and intermittent in the left groin.   Past Surgical History Doristine Devoid, CMA; 05/24/2019 3:54 PM) No pertinent past surgical history   Diagnostic Studies History Doristine Devoid, CMA; 05/24/2019 3:54 PM) Colonoscopy  never  Allergies Doristine Devoid, CMA; 05/24/2019 3:55 PM) Motrin *ANALGESICS - ANTI-INFLAMMATORY*   Medication History Doristine Devoid, CMA; 05/24/2019 3:57 PM) Gabapentin (100MG  Capsule, Oral) Active. hydrOXYzine HCl (25MG  Tablet, Oral) Active. Lisinopril-hydroCHLOROthiazide (20-25MG  Tablet, Oral) Active. Simvastatin (40MG  Tablet, Oral) Active. risperiDONE (1MG  Tablet, Oral) Active. Medications Reconciled  Social History , CMA; 05/24/2019 3:54 PM) Alcohol use  Occasional alcohol use. No caffeine use  Tobacco use  Former smoker.  Family History , CMA; 05/24/2019 3:54 PM) Colon Polyps  Family Members In General. Heart Disease  Family Members In General, Father. Heart disease in male family member before age 36   Other Problems 05/26/2019, CMA; 05/24/2019 3:54 PM) Anxiety Disorder  High blood pressure  Hypercholesterolemia  Inguinal Hernia     Review of Systems  05/26/2019 CMA; 05/24/2019 3:54 PM) General Present- Fatigue. Not Present- Appetite Loss, Chills, Fever, Night Sweats, Weight Gain and Weight Loss. Skin Present- Non-Healing Wounds. Not Present- Change in Wart/Mole, Dryness, Hives, Jaundice, New Lesions, Rash and Ulcer. Gastrointestinal Present- Hemorrhoids. Not Present- Abdominal Pain, Bloating, Bloody Stool, Change in Bowel Habits, Chronic diarrhea, Constipation, Difficulty Swallowing, Excessive gas, Gets full quickly at meals, Indigestion, Nausea, Rectal Pain and Vomiting. Neurological Present- Headaches. Not Present- Decreased Memory, Fainting, Numbness, Seizures, Tingling, Tremor, Trouble walking and Weakness. Psychiatric Present- Anxiety. Not Present- Bipolar, Change in Sleep Pattern, Depression, Fearful and Frequent crying.  Vitals (Chemira Jones CMA; 05/24/2019 3:55 PM) 05/24/2019 3:55 PM Weight: 257.6 lb Height: 64in Body Surface Area: 2.18 m Body Mass Index: 44.22 kg/m  BP: 144/82 (Sitting, Left Arm, Standard)       Physical Exam (Joushua Dugar A. Doristine Devoid MD; 05/24/2019 4:09 PM) The physical exam findings are as follows: Note:On exam, he does have a large left inguinal hernia which is tender but I can mostly reduce it. He has a smaller right inguinal hernia. Generally well in appearance Lungs clear CV RRR Abdomen otherwise soft Skin without rash    Assessment & Plan (Laramie Meissner A. 05/26/2019 MD; 05/24/2019 4:10 PM) BILATERAL INGUINAL HERNIA (K40.20) Impression: He is definitely quite symptomatic from inguinal hernias. I discussed the diagnosis with him in detail. We discussed surgical repair of hernias with mesh. I discussed both the open and laparoscopic techniques. I would like to proceed with a bilateral laparoscopic inguinal hernia repair with mesh. I discussed this with him in detail. I discussed the risk which includes but is not limited to bleeding, infection, the need to convert to an open procedure, injury to  surrounding structures, nerve entrapment, chronic pain,  postoperative recovery, etc. He understands and agrees to proceed. Surgery will be scheduled urgently

## 2019-06-17 ENCOUNTER — Encounter (HOSPITAL_COMMUNITY): Payer: Self-pay | Admitting: Certified Registered Nurse Anesthetist

## 2019-06-17 ENCOUNTER — Telehealth: Payer: Self-pay | Admitting: Internal Medicine

## 2019-06-17 ENCOUNTER — Ambulatory Visit (HOSPITAL_COMMUNITY): Admission: RE | Admit: 2019-06-17 | Payer: Self-pay | Source: Home / Self Care | Admitting: Surgery

## 2019-06-17 HISTORY — DX: Bipolar disorder, unspecified: F31.9

## 2019-06-17 HISTORY — DX: Anxiety disorder, unspecified: F41.9

## 2019-06-17 HISTORY — DX: Tic disorder, unspecified: F95.9

## 2019-06-17 HISTORY — DX: Sleep apnea, unspecified: G47.30

## 2019-06-17 HISTORY — DX: Hyperlipidemia, unspecified: E78.5

## 2019-06-17 SURGERY — REPAIR, HERNIA, INGUINAL, BILATERAL, LAPAROSCOPIC
Anesthesia: General | Laterality: Bilateral

## 2019-06-17 NOTE — Telephone Encounter (Signed)
Left detailed message for patient. Informed 11/4 is okay for surgery per Dr. Amil Amen and he can use tucks wipes and wipe gently.

## 2019-06-17 NOTE — Telephone Encounter (Signed)
Patient called to let Dr. Amil Amen knows his  Hernia surgery was canceled until further notice and to inform Perianal Fistula surgery is scheduled for 06/29/2019 @ 11:25 am. Patient also stated yesterday and the day before has been bleeding and has some inflammation and was told by [facility] to be evaluated by his PCP to determine or provide recommendation on if surgery has to be consider as Acute for them to be able  get him in sooner for that surgery.   Please advise.

## 2019-06-29 ENCOUNTER — Ambulatory Visit: Payer: Self-pay | Admitting: General Surgery

## 2019-06-29 NOTE — H&P (Signed)
  History of Present Illness Samuel Ruff MD; 85/11/6268 11:43 AM) The patient is a 38 year old male who presents with a complaint of anal problems. 38 year old male who presents to the office with a seven-year history of recurrent perianal abscesses. These occur every few months. He developed pain and swelling. The area then breaks open and purulent fluid is discharged. The skin and then heals up. He reports regular bowel habits and denies any frequent diarrhea.   Problem List/Past Medical Samuel Ruff, MD; 35/0/0938 11:43 AM) BILATERAL INGUINAL HERNIA (K40.20)  Past Surgical History Samuel Ruff, MD; 18/09/9935 11:43 AM) No pertinent past surgical history  Diagnostic Studies History Samuel Ruff, MD; 16/04/6788 11:43 AM) Colonoscopy never  Allergies Sabino Gasser, CMA; 06/29/2019 11:38 AM) Motrin *ANALGESICS - ANTI-INFLAMMATORY* Allergies Reconciled  Medication History Sabino Gasser, CMA; 06/29/2019 11:38 AM) hydrOXYzine HCl (25MG  Tablet, Oral) Active. Lisinopril-hydroCHLOROthiazide (20-25MG  Tablet, Oral) Active. Simvastatin (40MG  Tablet, Oral) Active. risperiDONE (1MG  Tablet, Oral) Active. Medications Reconciled  Social History Samuel Ruff, MD; 38/08/173 11:43 AM) Alcohol use Occasional alcohol use. No caffeine use Tobacco use Former smoker.  Family History Samuel Ruff, MD; 05/27/5851 11:43 AM) Colon Polyps Family Members In General. Heart Disease Family Members In General, Father. Heart disease in male family member before age 50  Other Problems Samuel Ruff, MD; 77/03/2422 11:43 AM) Anxiety Disorder High blood pressure Hypercholesterolemia Inguinal Hernia     Review of Systems Samuel Ruff MD; 53/01/1442 11:43 AM) General Present- Fatigue. Not Present- Appetite Loss, Chills, Fever, Night Sweats, Weight Gain and Weight Loss. Skin Present- Non-Healing Wounds. Not Present- Change in Wart/Mole, Dryness, Hives, Jaundice, New Lesions,  Rash and Ulcer. Gastrointestinal Present- Hemorrhoids. Not Present- Abdominal Pain, Bloating, Bloody Stool, Change in Bowel Habits, Chronic diarrhea, Constipation, Difficulty Swallowing, Excessive gas, Gets full quickly at meals, Indigestion, Nausea, Rectal Pain and Vomiting. Neurological Present- Headaches. Not Present- Decreased Memory, Fainting, Numbness, Seizures, Tingling, Tremor, Trouble walking and Weakness. Psychiatric Present- Anxiety. Not Present- Bipolar, Change in Sleep Pattern, Depression, Fearful and Frequent crying.  Vitals Sabino Gasser CMA; 06/29/2019 11:39 AM) 06/29/2019 11:38 AM Weight: 263.6 lb Height: 64in Body Surface Area: 2.2 m Body Mass Index: 45.25 kg/m  Temp.: 97.82F(Oral)  Pulse: 97 (Regular)  BP: 140/82 (Sitting, Left Arm, Standard)        Physical Exam Samuel Ruff MD; 15/11/84 11:49 AM)  Chest and Lung Exam Chest and lung exam reveals -normal excursion with symmetric chest walls.  Cardiovascular Cardiovascular examination reveals -no digital clubbing, cyanosis, edema, increased warmth or tenderness.  Abdomen Palpation/Percussion Palpation and Percussion of the abdomen reveal - Soft and Non Tender.  Rectal Note: Left anterior midline external opening with no active drainage. Palpable cord noted    Assessment & Plan Samuel Ruff MD; 76/08/9507 11:53 AM)  ANAL FISTULA (K60.3) Impression: 38 year old male who presents to the office for evaluation of an anal fistula. He has noted recurrent abscesses for the past 6-7 years. On exam today, he has a left anterior external opening, very close to midline. We discussed that this most likely does represent a fistula. We discussed surgical options include fistulotomy versus seton placement and eventual lift procedure. We discussed if the fistulotomy is performed. There is a small risk of incontinence. If a seton is placed, he will need additional surgery in the future. All questions were  answered. Typical recovery times and recurrence rates were discussed.

## 2019-08-02 ENCOUNTER — Other Ambulatory Visit: Payer: Self-pay

## 2019-08-02 ENCOUNTER — Emergency Department (HOSPITAL_COMMUNITY)
Admission: EM | Admit: 2019-08-02 | Discharge: 2019-08-02 | Disposition: A | Discharge status: Home / Self Care | Payer: Self-pay | Attending: Emergency Medicine | Admitting: Emergency Medicine

## 2019-08-02 ENCOUNTER — Encounter (HOSPITAL_COMMUNITY): Payer: Self-pay | Admitting: Emergency Medicine

## 2019-08-02 DIAGNOSIS — Z87891 Personal history of nicotine dependence: Secondary | ICD-10-CM | POA: Insufficient documentation

## 2019-08-02 DIAGNOSIS — I10 Essential (primary) hypertension: Secondary | ICD-10-CM | POA: Insufficient documentation

## 2019-08-02 DIAGNOSIS — Z79899 Other long term (current) drug therapy: Secondary | ICD-10-CM | POA: Insufficient documentation

## 2019-08-02 DIAGNOSIS — U071 COVID-19: Secondary | ICD-10-CM | POA: Insufficient documentation

## 2019-08-02 LAB — POC SARS CORONAVIRUS 2 AG -  ED: SARS Coronavirus 2 Ag: POSITIVE — AB

## 2019-08-02 MED ORDER — BENZONATATE 100 MG PO CAPS: 100.0000 mg | ORAL_CAPSULE | Freq: Three times a day (TID) | ORAL | 0 refills | Status: DC | PRN

## 2019-08-02 MED ORDER — IPRATROPIUM BROMIDE 0.03 % NA SOLN: 2.0000 | Freq: Three times a day (TID) | NASAL | 12 refills | Status: DC | PRN

## 2019-08-02 MED ORDER — ACETAMINOPHEN 500 MG PO TABS: 500.0000 mg | ORAL_TABLET | Freq: Four times a day (QID) | ORAL | 0 refills | Status: DC | PRN

## 2019-08-02 NOTE — ED Triage Notes (Signed)
Pt reports cough, chills, body aches, fever and unable to taste or smell. Pt reports he has been taking cough medicine with no relief.

## 2019-08-02 NOTE — ED Provider Notes (Signed)
[facility] EMERGENCY DEPARTMENT Provider Note   CSN: [id number] Arrival date & time: 08/02/19  1452     History   Chief Complaint Chief Complaint  Patient presents with  . URI    HPI Samuel Austin is a 38 y.o. male with history of bipolar disorder, hyperlipidemia, hypertension, OSA on CPAP presents today for evaluation of acute onset, progressively worsening fevers, myalgias, cough, nasal congestion, sinus pressure, anosmia for 4 days.  Denies shortness of breath but reports difficulty breathing through his nose due to congestion.  Denies chest pain, abdominal pain, nausea or vomiting.  No shortness of breath with exertion.  Has been taking over-the-counter medications including Tylenol, DayQuil, NyQuil, emergent C without relief of symptoms.  No known sick contacts.     The history is provided by the patient.    Past Medical History:  Diagnosis Date  . Anxiety    panic attacks  . Bipolar disorder ([place])   . Depression   . Hyperchloremia   . Hyperlipidemia   . Hypertension   . Sleep apnea    uses CPAP nightly  . Tic disorder     Patient Active Problem List   Diagnosis Date Noted  . Tic disorder 08/21/2018  . Dizziness 04/13/2018  . Hypertension 03/11/2018  . Hyperlipidemia 03/11/2018  . Episodic mood disorder ([place]) 03/11/2018    History reviewed. No pertinent surgical history.      Home Medications    Prior to Admission medications   Medication Sig Start Date End Date Taking? Authorizing Provider  acetaminophen (TYLENOL) 500 MG tablet Take 1 tablet (500 mg total) by mouth every 6 (six) hours as needed. 08/02/19   Rowena Moilanen A, PA-C  benzonatate (TESSALON) 100 MG capsule Take 1 capsule (100 mg total) by mouth 3 (three) times daily as needed for cough. 08/02/19   Marshella Tello A, PA-C  ciprofloxacin (CIPRO) 500 MG tablet Take 1 tablet (500 mg total) by mouth 2 (two) times daily. Patient not taking: Reported on 05/25/2019 05/11/19    Mack Hook, MD  gabapentin (NEURONTIN) 100 MG capsule Take 1 capsule (100 mg total) by mouth 3 (three) times daily as needed (burning pain). Patient not taking: Reported on 06/13/2019 05/13/19   Julianne Rice, MD  HYDROcodone-acetaminophen [facility]) 5-325 MG tablet Take 1 tablet by mouth every 6 (six) hours as needed for severe pain. Patient not taking: Reported on 05/25/2019 05/13/19   Julianne Rice, MD  hydrOXYzine (ATARAX/VISTARIL) 25 MG tablet Take 1 tablet (25 mg total) by mouth every 8 (eight) hours as needed for up to 20 doses for anxiety. Patient taking differently: Take 25 mg by mouth daily.  08/16/18   Mack Hook, MD  ipratropium (ATROVENT) 0.03 % nasal spray Place 2 sprays into both nostrils 3 (three) times daily as needed for rhinitis. 08/02/19   Nils Flack, Bridgit Eynon A, PA-C  lisinopril-hydrochlorothiazide (ZESTORETIC) 20-25 MG tablet Take 1 tablet by mouth once daily Patient taking differently: Take 1 tablet by mouth daily after lunch.  04/18/19   Mack Hook, MD  metroNIDAZOLE (FLAGYL) 500 MG tablet 1 tab by mouth 3 times daily for 10 days Patient not taking: Reported on 05/25/2019 05/11/19   Mack Hook, MD  simvastatin (ZOCOR) 40 MG tablet Take 1 tablet (40 mg total) by mouth at bedtime. Patient taking differently: Take 40 mg by mouth daily in the afternoon.  01/18/19   Mack Hook, MD  Witch Hazel (PREPARATION H) 50 % PADS Place 1 application rectally as needed (discomfort).  [provider]    Family History Family History  Problem Relation Age of Onset  . Drug abuse Mother        crack  . Bipolar disorder Mother   . Heart disease Mother        Carotid artery disease  . Alcohol abuse Father     Social History Social History   Tobacco Use  . Smoking status: Former Smoker    Types: Cigars    Quit date: 11/2018    Years since quitting: 0.6  . Smokeless tobacco: Never Used  Substance Use Topics  . Alcohol use: Yes     Comment: occasional  . Drug use: Not Currently    Types: Marijuana    Comment: Last use 07/2018     Allergies   Motrin [ibuprofen] and Lactose intolerance (gi)   Review of Systems Review of Systems  Constitutional: Positive for chills and fever.  HENT: Positive for congestion and sinus pressure.   Respiratory: Positive for cough.   Cardiovascular: Negative for chest pain.  Gastrointestinal: Negative for abdominal pain, nausea and vomiting.  Musculoskeletal: Negative for neck stiffness.  Neurological: Positive for headaches.  All other systems reviewed and are negative.    Physical Exam Updated Vital Signs BP (!) 128/93   Pulse 100   Temp 100 F (37.8 C) (Oral)   Resp 19   Ht 5\' 4"  (1.626 m)   Wt 115.7 kg   SpO2 96%   BMI 43.77 kg/m   Physical Exam Vitals signs and nursing note reviewed.  Constitutional:      General: He is not in acute distress.    Appearance: He is well-developed.  HENT:     Head: Normocephalic and atraumatic.     Nose: Congestion present.     Comments: Sounds audibly congested    Mouth/Throat:     Mouth: Mucous membranes are moist.     Comments: Tolerating secretions without difficulty. Eyes:     General:        Right eye: No discharge.        Left eye: No discharge.     Conjunctiva/sclera: Conjunctivae normal.  Neck:     Musculoskeletal: Normal range of motion and neck supple. No neck rigidity.     Vascular: No JVD.     Trachea: No tracheal deviation.  Cardiovascular:     Rate and Rhythm: Normal rate and regular rhythm.  Pulmonary:     Effort: Pulmonary effort is normal.     Breath sounds: Normal breath sounds.     Comments: Speaking in full sentences without difficulty, SPO2 saturations 96 to 98% on room air and with ambulation. Abdominal:     General: Abdomen is flat. There is no distension.     Palpations: Abdomen is soft.     Tenderness: There is no abdominal tenderness. There is no guarding or rebound.  Lymphadenopathy:      Cervical: No cervical adenopathy.  Skin:    General: Skin is warm and dry.     Findings: No erythema.  Neurological:     General: No focal deficit present.     Mental Status: He is alert and oriented to person, place, and time.     Cranial Nerves: No cranial nerve deficit.     Motor: No weakness.     Gait: Gait normal.  Psychiatric:        Behavior: Behavior normal.      ED Treatments / Results  Labs (all labs ordered are  listed, but only abnormal results are displayed) Labs Reviewed  POC SARS CORONAVIRUS 2 AG -  ED - Abnormal; Notable for the following components:      Result Value   SARS Coronavirus 2 Ag POSITIVE (*)    All other components within normal limits    EKG None  Radiology No results found.  Procedures Procedures (including critical care time)  Medications Ordered in ED Medications - No data to display   Initial Impression / Assessment and Plan / ED Course  I have reviewed the triage vital signs and the nursing notes.  Pertinent labs & imaging results that were available during my care of the patient were reviewed by me and considered in my medical decision making (see chart for details).       Samuel Austin was evaluated in Emergency Department on 08/02/2019 for the symptoms described in the history of present illness. He was evaluated in the context of the global COVID-19 pandemic, which necessitated consideration that the patient might be at risk for infection with the SARS-CoV-2 virus that causes COVID-19. Institutional protocols and algorithms that pertain to the evaluation of patients at risk for COVID-19 are in a state of rapid change based on information released by regulatory bodies including the CDC and federal and state organizations. These policies and algorithms were followed during the patient's care in the ED.  Patient presenting for evaluation of myalgias, fevers, cough, nasal congestion/sinus pressure.  He exhibits low-grade  fevers in the ED, vital signs otherwise stable.  He is nontoxic in appearance.  Tolerating p.o. food and fluids at home.  No meningeal signs to suggest meningitis.  His rapid Covid test is positive.  No evidence of respiratory distress on assessment and lungs are clear to auscultation bilaterally.  He was ambulated in the ED with stable SPO2 saturations.  Discussed quarantining at home, purchasing pulse oximeter to check oxygen levels, symptomatic management, close PCP follow-up.  Discussed strict ED return precautions. Pt verbalized understanding of and agreement with plan and is safe for discharge home at this time.   Final Clinical Impressions(s) / ED Diagnoses   Final diagnoses:  COVID-19 virus detected    ED Discharge Orders         Ordered    benzonatate (TESSALON) 100 MG capsule  3 times daily PRN     08/02/19 1735    ipratropium (ATROVENT) 0.03 % nasal spray  3 times daily PRN     08/02/19 1735    acetaminophen (TYLENOL) 500 MG tablet  Every 6 hours PRN     08/02/19 1735           Jeanie SewerFawze, Katherin Ramey A, PA-C 08/02/19 1736    Gerhard MunchLockwood, Robert, MD 08/03/19 1738

## 2019-08-02 NOTE — ED Notes (Signed)
Patient verbalizes understanding of discharge instructions. Opportunity for questioning and answers were provided. Armband removed by staff, pt discharged from ED.  

## 2019-08-02 NOTE — Discharge Instructions (Signed)
Your Covid test today was positive.  Take 1 to 2 tablets of extra strength Tylenol every 6 hours as needed for fever/pain.  Do not exceed more than 4000 mg of Tylenol daily.  If you need to you can take 400 to 600 mg of ibuprofen every 6 hours as needed for fever pain additionally.  Start using ipratropium nasal spray for nasal congestion.  Take Tessalon as needed for cough.  Drink lots of fluids and get lots of rest.  I would recommend purchasing a pulse oximeter which is a device that checks your oxygen levels.  Please return to the emergency department immediately if your oxygen levels dropped lower than 90% using the device.  Also return to the emergency department immediately if any concerning signs or symptoms develop such as severe shortness of breath or chest pains, persistent vomiting, high fevers uncontrolled by ibuprofen or Tylenol, or loss of consciousness.  You can also follow-up with your primary care provider for reevaluation of your symptoms if they persist.  Please quarantine at home for the next 2 weeks at least.

## 2019-08-03 ENCOUNTER — Telehealth: Payer: Self-pay | Admitting: Internal Medicine

## 2019-08-03 NOTE — Telephone Encounter (Signed)
Spoke with patient. States he started with symptoms on 07/31/2019. States loss of taste ,smell body aches, fever and cough. Patient stated he felt worse over Monday and Tuesday and decide to go to ER. Patient is positive for COVID-19. Patient has been educated on all precautions with COVID and with self-isolating and quarantining for the rest of his household. Patient informed a contact tracer from our office will be giving him a call. Patient verbalized understanding.  To Teresita to assign contact tracer

## 2019-08-03 NOTE — Telephone Encounter (Signed)
Patient on the line stating was hospitalized last night and was told is + with COVID19.  Routing to Cherice to obtained more information.

## 2019-09-03 ENCOUNTER — Other Ambulatory Visit (HOSPITAL_COMMUNITY): Payer: Self-pay

## 2019-09-08 NOTE — Progress Notes (Signed)
Pt has had a positive covid test 08-02-2019 and symptomatic(ED visit) , results in epic.  Called and spoke w/ pt via phone, informed him he did not need a covid test for surgery due to his positive test 08-02-2019 . Per  guidelines no test until after 90 days. Pt states his symptoms resolved in two weeks with exception his smell is not at 100%.  Informed him he can still have his surgery since he has been over 10 days asymptomatic.

## 2019-09-09 ENCOUNTER — Encounter (HOSPITAL_BASED_OUTPATIENT_CLINIC_OR_DEPARTMENT_OTHER): Payer: Self-pay | Admitting: General Surgery

## 2019-09-10 ENCOUNTER — Other Ambulatory Visit (HOSPITAL_COMMUNITY): Payer: Self-pay

## 2019-09-14 ENCOUNTER — Ambulatory Visit (HOSPITAL_BASED_OUTPATIENT_CLINIC_OR_DEPARTMENT_OTHER): Admission: RE | Admit: 2019-09-14 | Payer: Self-pay | Source: Home / Self Care | Admitting: General Surgery

## 2019-09-14 ENCOUNTER — Encounter (HOSPITAL_BASED_OUTPATIENT_CLINIC_OR_DEPARTMENT_OTHER): Admission: RE | Payer: Self-pay | Source: Home / Self Care

## 2019-09-14 HISTORY — DX: Palpitations: R00.2

## 2019-09-14 HISTORY — DX: Panic disorder (episodic paroxysmal anxiety): F41.0

## 2019-09-14 HISTORY — DX: Post-traumatic stress disorder, unspecified: F43.10

## 2019-09-14 HISTORY — DX: Pulsatile tinnitus, unspecified ear: H93.A9

## 2019-09-14 HISTORY — DX: Tourette's disorder: F95.2

## 2019-09-14 HISTORY — DX: Dizziness and giddiness: R42

## 2019-09-14 HISTORY — DX: Anal fistula, unspecified: K60.30

## 2019-09-14 HISTORY — DX: Obesity, unspecified: E66.9

## 2019-09-14 HISTORY — DX: Anal fistula: K60.3

## 2019-09-14 SURGERY — EXAM UNDER ANESTHESIA, RECTUM
Anesthesia: General

## 2019-09-20 ENCOUNTER — Telehealth: Payer: Self-pay | Admitting: Internal Medicine

## 2019-09-20 NOTE — Telephone Encounter (Signed)
Patient called requesting General Surgery referral  for the perianal fistula through [facility]   Please advise

## 2019-09-23 NOTE — Telephone Encounter (Signed)
To Dr. Mulberry to put in referral 

## 2019-09-23 NOTE — Telephone Encounter (Signed)
Called patient and no answer and mailbox is full

## 2019-10-21 ENCOUNTER — Telehealth: Payer: Self-pay | Admitting: Internal Medicine

## 2019-10-21 NOTE — Telephone Encounter (Signed)
To Dr. Delrae Alfred to put in new referral for general surgery for hernia repair

## 2019-10-21 NOTE — Telephone Encounter (Signed)
Correction referral if for fistula

## 2019-10-21 NOTE — Telephone Encounter (Signed)
Patient called requesting speak with Cherice to obtain update on his surgery referral.  Please advise.

## 2020-01-08 IMAGING — DX DG CHEST 2V
2 series · 2 of 2 positions shown · non-contrast
Comparison: Radiographs January 21, 2018.

CLINICAL DATA: Chest pain.

EXAM:
CHEST - 2 VIEW

[chest pa]
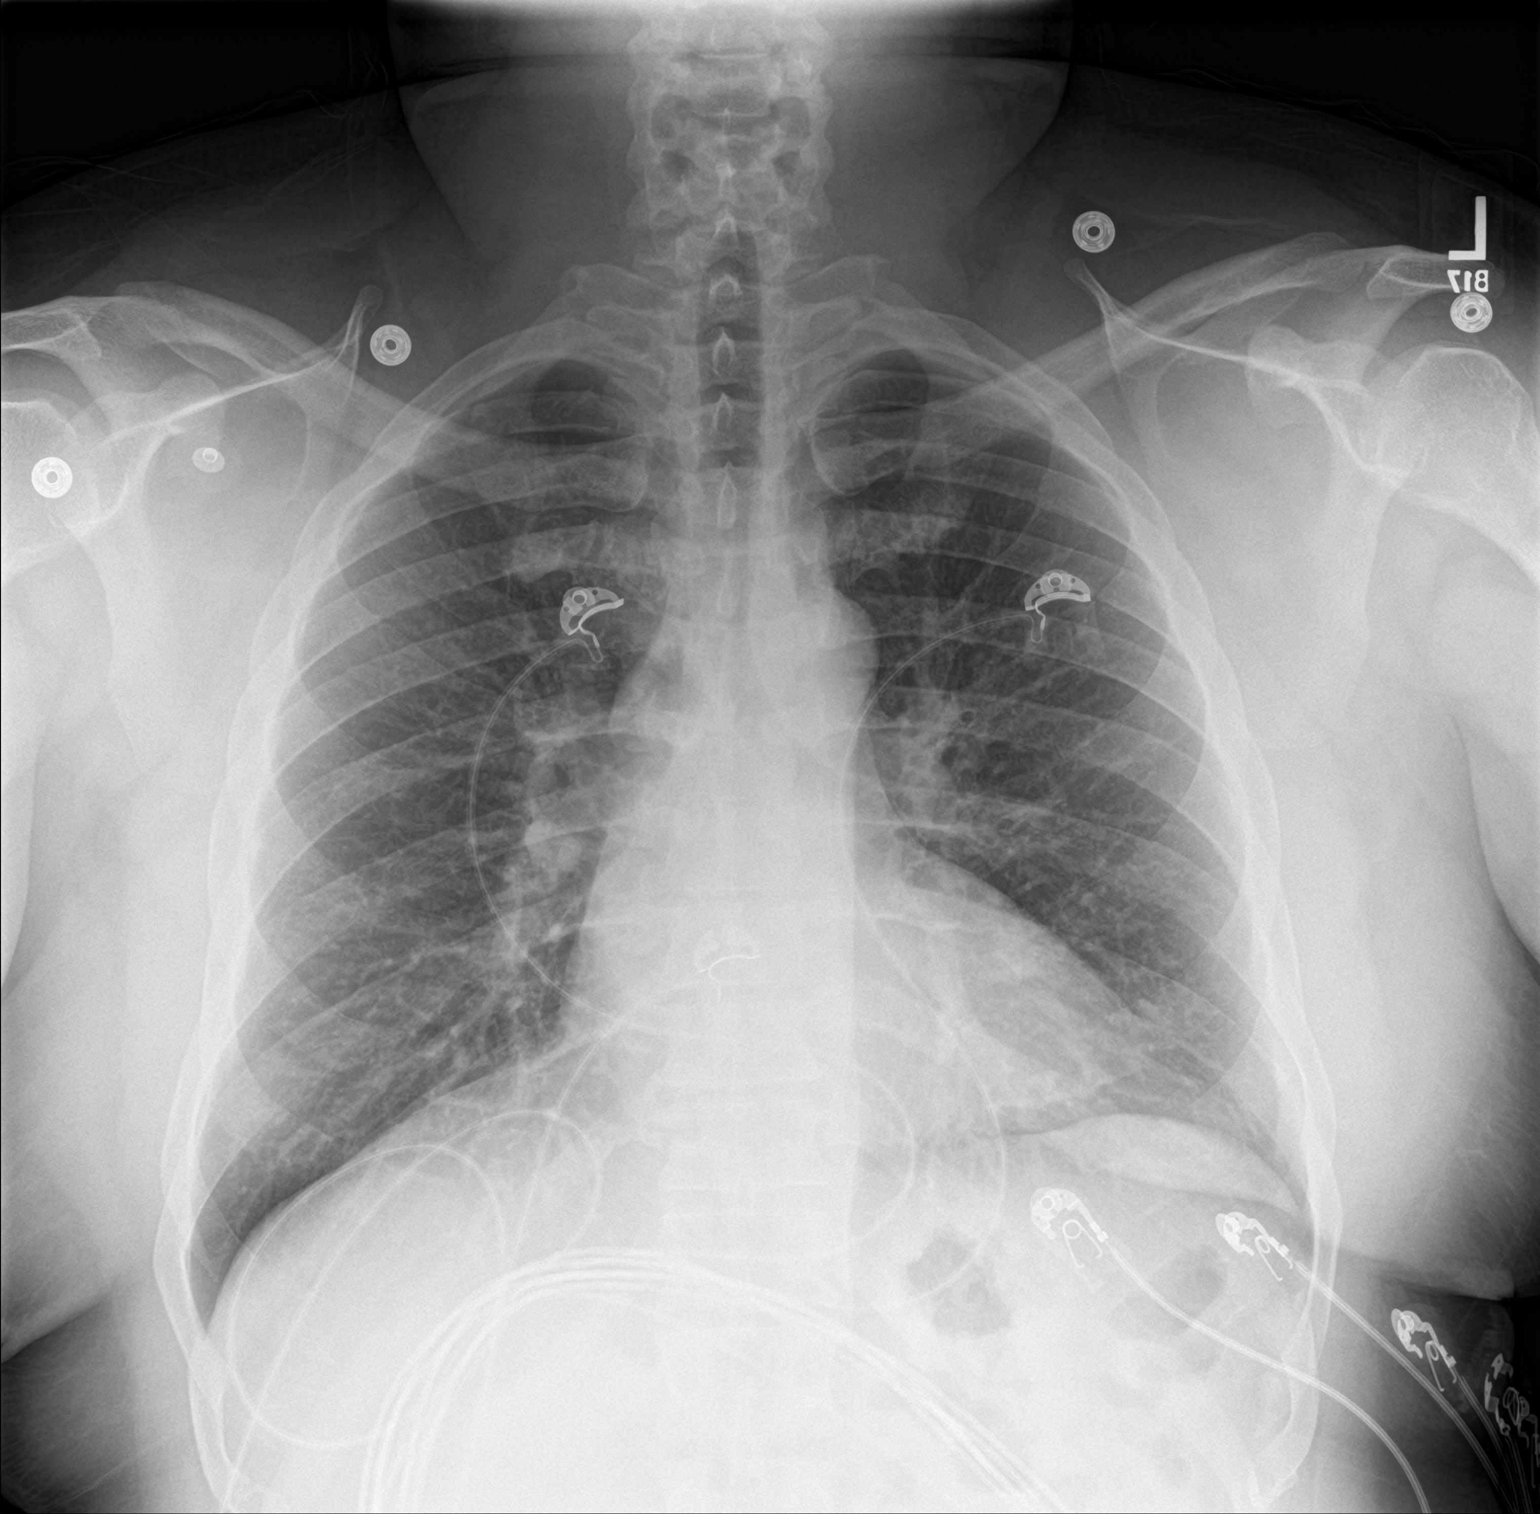

[chest lat]
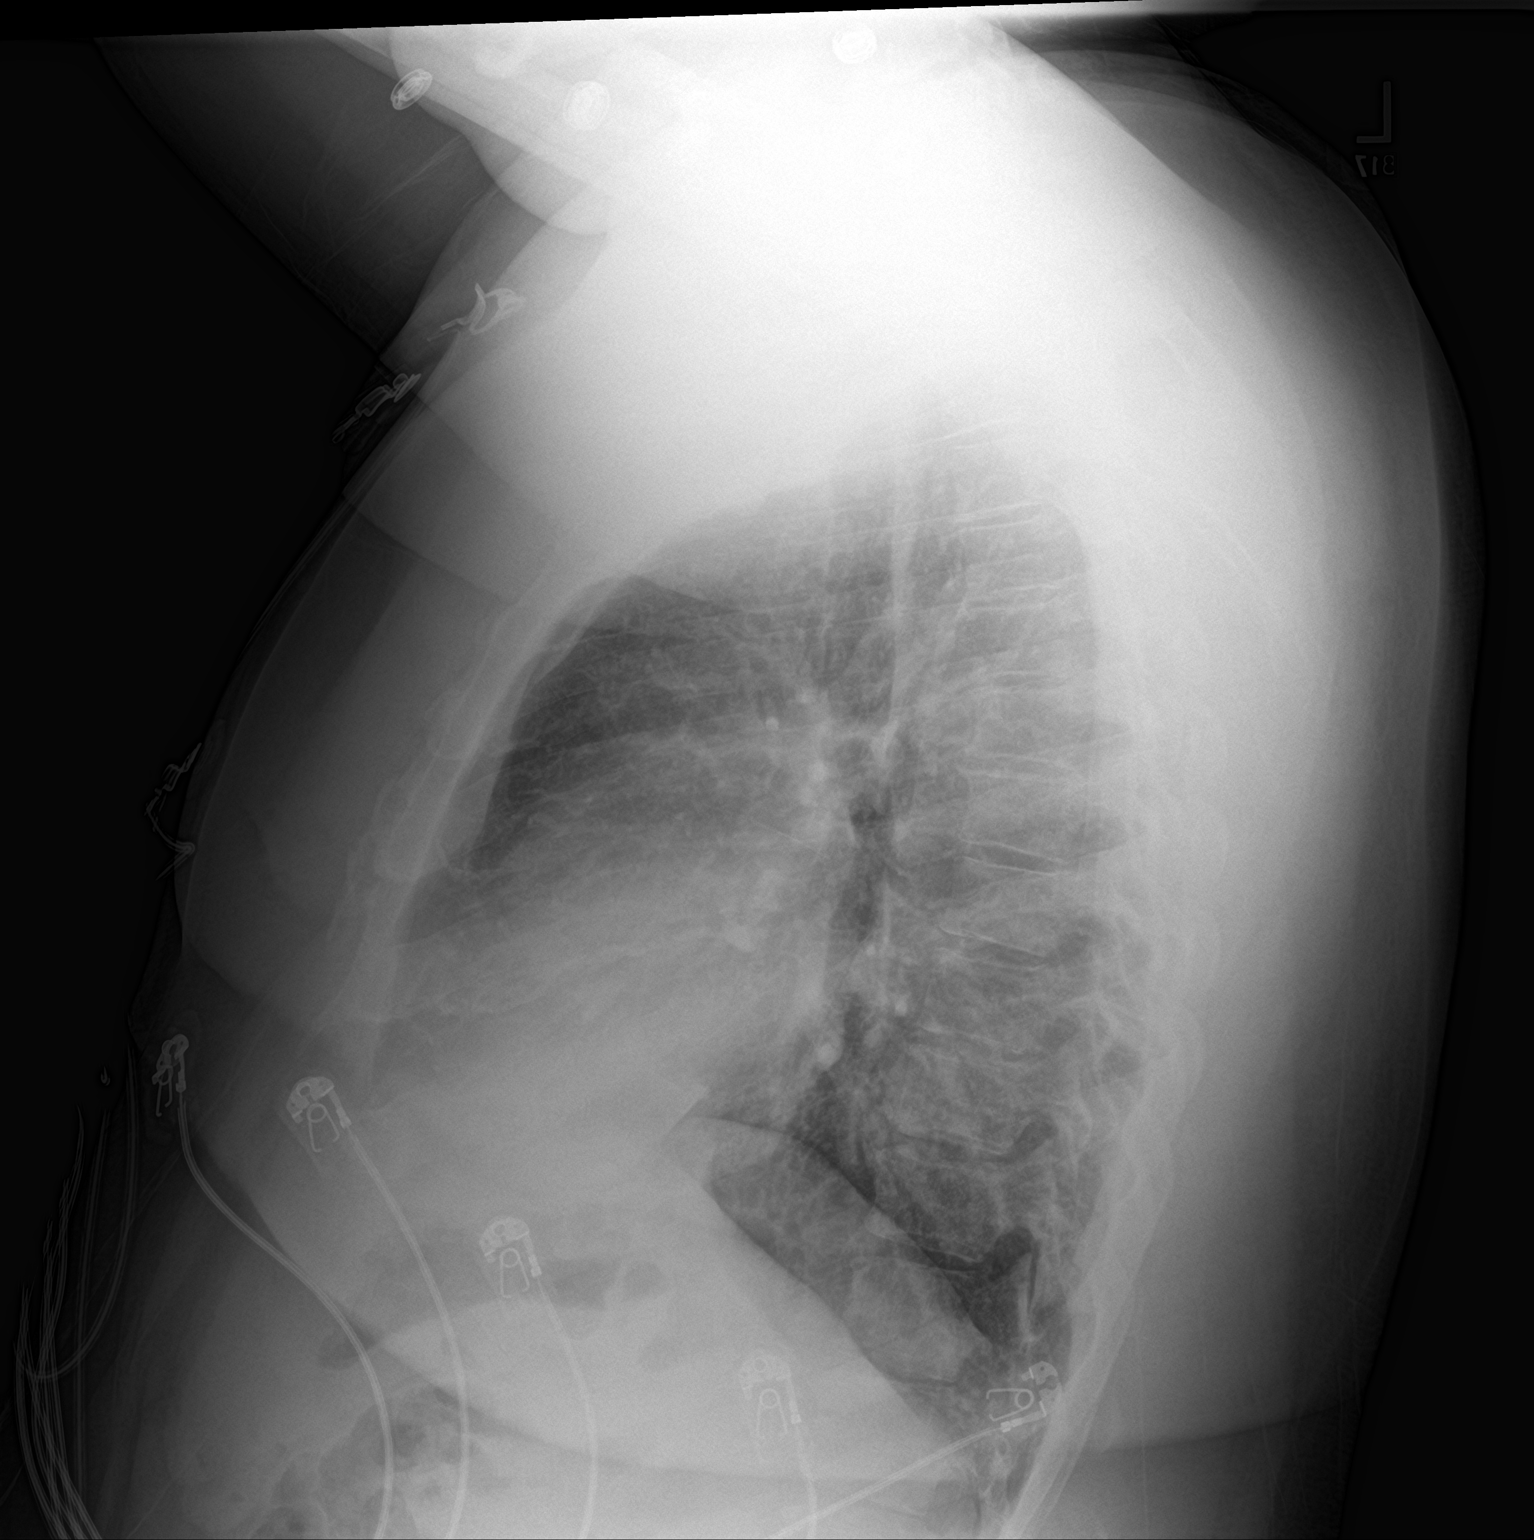

[2 of 2 positions shown; findings below may reference images not displayed]

FINDINGS: The heart size and mediastinal contours are within normal limits.
Both lungs are clear. No pneumothorax or pleural effusion is noted.
The visualized skeletal structures are unremarkable.
IMPRESSION: No active cardiopulmonary disease.

## 2020-01-17 ENCOUNTER — Encounter: Payer: Self-pay | Admitting: Cardiovascular Disease

## 2020-01-17 ENCOUNTER — Other Ambulatory Visit: Payer: Self-pay

## 2020-01-17 ENCOUNTER — Ambulatory Visit (INDEPENDENT_AMBULATORY_CARE_PROVIDER_SITE_OTHER): Payer: Self-pay | Admitting: Cardiovascular Disease

## 2020-01-17 VITALS — BP 130/70 | HR 89 | Temp 97.7°F | Ht 64.0 in | Wt 264.0 lb

## 2020-01-17 DIAGNOSIS — I10 Essential (primary) hypertension: Secondary | ICD-10-CM

## 2020-01-17 DIAGNOSIS — G4733 Obstructive sleep apnea (adult) (pediatric): Secondary | ICD-10-CM

## 2020-01-17 DIAGNOSIS — E782 Mixed hyperlipidemia: Secondary | ICD-10-CM

## 2020-01-17 DIAGNOSIS — R002 Palpitations: Secondary | ICD-10-CM

## 2020-01-17 MED ORDER — LISINOPRIL 20 MG PO TABS: 20.0000 mg | ORAL_TABLET | Freq: Every day | ORAL | 3 refills | Status: DC

## 2020-01-17 MED ORDER — HYDROCHLOROTHIAZIDE 12.5 MG PO CAPS: 12.5000 mg | ORAL_CAPSULE | ORAL | 3 refills | Status: DC | PRN

## 2020-01-17 NOTE — Patient Instructions (Signed)
Medication Instructions:  STOP TAKING YOUR LISINOPRIL-HCTZ  START TAKING LISINOPRIL 20MG  DAILY  START TAKING HCTZ 12.5MG  AS NEEDED FOR SWELLING  *If you need a refill on your cardiac medications before your next appointment, please call your pharmacy*   Lab Work: FASTING LABS: CMET LIPID TSH CBC  If you have labs (blood work) drawn today and your tests are completely normal, you will receive your results only by: MyChart Message (if you have MyChart) OR . A paper copy in the mail If you have any lab test that is abnormal or we need to change your treatment, we will call you to review the results.   Follow-Up: At [facility], you and your health needs are our priority.  As part of our continuing mission to provide you with exceptional heart care, we have created designated Provider Care Teams.  These Care Teams include your primary Cardiologist (physician) and Advanced Practice Providers (APPs -  Physician Assistants and Nurse Practitioners) who all work together to provide you with the care you need, when you need it.  We recommend signing up for the patient portal called "MyChart".  Sign up information is provided on this After Visit Summary.  MyChart is used to connect with patients for Virtual Visits (Telemedicine).  Patients are able to view lab/test results, encounter notes, upcoming appointments, etc.  Non-urgent messages can be sent to your provider as well.   To learn more about what you can do with MyChart, go to [facility].    Your next appointment:   3 month(s)  The format for your next appointment:   In Person  Provider:   [email or web address], MD

## 2020-01-19 ENCOUNTER — Telehealth: Payer: Self-pay | Admitting: Cardiovascular Disease

## 2020-01-19 NOTE — Telephone Encounter (Signed)
1) What problem are you experiencing? Calling to give CPAP numbers so Dr. Tresa Endo is able to track it  2) Who is your medical equipment company? [email or web address]  Serial number # [id number] Ref: [id number] Lot: [id number]   Please route to the sleep study assistant.

## 2020-01-23 ENCOUNTER — Encounter: Payer: Self-pay | Admitting: Cardiovascular Disease

## 2020-01-23 NOTE — Progress Notes (Signed)
Cardiology Office Note    Date:  01/23/2020   ID:  Samuel Austin, DOB 09-27-1980, MRN [id number]  PCP:  Mack Hook, MD  Cardiologist:  Shelva Majestic, MD   44-monthfollow-up sleep evaluation  History of Present Illness:  Samuel Austin a 39y.o. male who has a history of anxiety and depression, hypertension, hyperlipidemia, as well as palpitations.  There also was a family history for sleep apnea.  The patient is followed by Dr. MAmil Amenfor primary care and has seen Dr. ARonn Melenawith atypical chest pain.  The patient has been on lisinopril HCT for hypertension, hydroxyzine for anxiety in addition to Risperdal and Celexa.  Due to concerns for obstructive sleep apnea with significant loud snoring, he was referred for a sleep study which was done on August 27, 2018.  This revealed severe sleep apnea with an AHI of 37.4, RDI of 79.4, and AHI with supine sleep at 72.9.  The patient was unable to achieve REM sleep in the diagnostic portion of the study.  CPAP was instituted and he was titrated up to 13 cm where AHI was 4.1.  CPAP auto therapy was recommended and he received a ResMed air sense 10 CPAP auto unit with pressure settings with a minimum of 13 up to a maximum of 20.  I saw him for initial sleep evaluation in a telemedicine visit on December 07, 2018.  Since initiating CPAP therapy, he has noticed remarkable benefit.  He now has significantly more energy.  Previously he was having 3-4 episodes of nocturia per night and this is 0-1 time per night.  Previously he would wake up gasping for breath.  His frequent awakenings have resolved and he is now sleeping most of the night.  He typically sleeps 8 hours per night.  A download was obtained since CPAP initiation from March 15 through December 06, 2018.  He is 100% compliant.  He is averaging 8 hours and 55 minutes of sleep per night.  AHI is excellent at 0.9 with his 95th percentile pressure at 14.8 and maximum pressure at  16.  He has noticed disappearance of prior snoring.  He now feels like he is getting into deeper sleep.  He is using a full facemask.  He states he has been getting supplies on [email or web address]  Upon further questions, he was concerned about his lipid status.  He states his primary doctor had taken him off his lovastatin therapy.  He is concerned since laboratory in August 2019 was still elevated.  Total cholesterol was 222, triglycerides 265, HDL 40, VLDL 53, LDL cholesterol 129.  He was advised to improve diet exercise.    Since I last saw him, he has can continue to use CPAP therapy.  However he was using CPAP with 100% compliance but his machine malfunction.  As result from mid March he was unable to use CPAP therapy at all until he received a new machine approximately 1 month ago.  Prior to his machine breaking a download from September 21, 2020 had revealed 100% usage until March 17.  He had a CPAP minimum pressure of 13 with maximum pressure of 20 and his 95th percentile pressure was 16.2 with a maximum average pressure of 17.2.  He apparently received a new machine which has not been linked to our office so I was unable to obtain the most recent download from the new machine.  Presently he goes to bed between 11 PM and midnight.  He is unaware  of breakthrough snoring.  Epworth Sleepiness Scale score was calculated in the office today and this endorsed at 15 consistent with residual daytime sleepiness.  Past Medical History:  Diagnosis Date  . Anal fistula   . Anxiety   . Bilateral inguinal hernia 07/2018  . Bipolar disorder ([place])   . Depression   . Heart palpitations   . Hyperchloremia   . Hyperlipidemia   . Hypertension   . Obesity   . Panic disorder   . PTSD (post-traumatic stress disorder)   . Pulsatile tinnitus   . Sleep apnea    uses CPAP nightly  . Tic disorder   . Tourette disorder   . Vertigo     No past surgical history on file.  Current Medications: Outpatient Medications  Prior to Visit  Medication Sig Dispense Refill  . acetaminophen (TYLENOL) 500 MG tablet Take 1 tablet (500 mg total) by mouth every 6 (six) hours as needed. 30 tablet 0  . benzonatate (TESSALON) 100 MG capsule Take 1 capsule (100 mg total) by mouth 3 (three) times daily as needed for cough. 21 capsule 0  . ciprofloxacin (CIPRO) 500 MG tablet Take 1 tablet (500 mg total) by mouth 2 (two) times daily. 20 tablet 0  . gabapentin (NEURONTIN) 100 MG capsule Take 1 capsule (100 mg total) by mouth 3 (three) times daily as needed (burning pain). 30 capsule 0  . HYDROcodone-acetaminophen (NORCO) 5-325 MG tablet Take 1 tablet by mouth every 6 (six) hours as needed for severe pain. 10 tablet 0  . hydrOXYzine (ATARAX/VISTARIL) 25 MG tablet Take 1 tablet (25 mg total) by mouth every 8 (eight) hours as needed for up to 20 doses for anxiety. (Patient taking differently: Take 25 mg by mouth daily. ) 60 tablet 1  . ipratropium (ATROVENT) 0.03 % nasal spray Place 2 sprays into both nostrils 3 (three) times daily as needed for rhinitis. 30 mL 12  . metroNIDAZOLE (FLAGYL) 500 MG tablet 1 tab by mouth 3 times daily for 10 days 30 tablet 0  . simvastatin (ZOCOR) 40 MG tablet Take 1 tablet (40 mg total) by mouth at bedtime. (Patient taking differently: Take 40 mg by mouth daily in the afternoon. ) 30 tablet 11  . Witch Hazel (PREPARATION H) 50 % PADS Place 1 application rectally as needed (discomfort).     Marland Kitchen lisinopril-hydrochlorothiazide (ZESTORETIC) 20-25 MG tablet Take 1 tablet by mouth once daily (Patient taking differently: Take 1 tablet by mouth daily after lunch. ) 90 tablet 3   No facility-administered medications prior to visit.     Allergies:   Motrin [ibuprofen] and Lactose intolerance (gi)   Social History   Socioeconomic History  . Marital status: Single    Spouse name: Not on file  . Number of children: Not on file  . Years of education: Not on file  . Highest education level: Not on file   Occupational History  . Not on file  Tobacco Use  . Smoking status: Former Smoker    Types: Cigars    Quit date: 11/2018    Years since quitting: 1.1  . Smokeless tobacco: Never Used  Substance and Sexual Activity  . Alcohol use: Yes    Comment: occasional  . Drug use: Not Currently    Types: Marijuana    Comment: Last use 07/2018  . Sexual activity: Not on file  Other Topics Concern  . Not on file  Social History Narrative   Lives with a male friend  Mother moved to [place] from [place] in recent years.    No children.  Thought he had a daughter, but DNA showed he was not her father.   Social Determinants of Health   Financial Resource Strain:   . Difficulty of Paying Living Expenses:   Food Insecurity:   . Worried About Charity fundraiser in the Last Year:   . Arboriculturist in the Last Year:   Transportation Needs:   . Film/video editor (Medical):   Marland Kitchen Lack of Transportation (Non-Medical):   Physical Activity:   . Days of Exercise per Week:   . Minutes of Exercise per Session:   Stress:   . Feeling of Stress :   Social Connections:   . Frequency of Communication with Friends and Family:   . Frequency of Social Gatherings with Friends and Family:   . Attends Religious Services:   . Active Member of Clubs or Organizations:   . Attends Archivist Meetings:   Marland Kitchen Marital Status:      Family History:  The patient's family history includes Alcohol abuse in his father; Bipolar disorder in his mother; Drug abuse in his mother; Heart disease in his mother.   ROS General: Negative; No fevers, chills, or night sweats;  HEENT: Negative; No changes in vision or hearing, sinus congestion, difficulty swallowing Pulmonary: Negative; No cough, wheezing, shortness of breath, hemoptysis Cardiovascular: Negative; No chest pain, presyncope, syncope, palpitations GI: Negative; No nausea, vomiting, diarrhea, or abdominal pain GU: Negative; No dysuria,  hematuria, or difficulty voiding Musculoskeletal: Negative; no myalgias, joint pain, or weakness Hematologic/Oncology: Negative; no easy bruising, bleeding Endocrine: Negative; no heat/cold intolerance; no diabetes Neuro: Negative; no changes in balance, headaches Skin: Negative; No rashes or skin lesions Psychiatric: Negative; No behavioral problems, depression Sleep: Negative; No snoring, daytime sleepiness, hypersomnolence, bruxism, restless legs, hypnogognic hallucinations, no cataplexy Other comprehensive 14 point system review is negative.   PHYSICAL EXAM:   VS:  BP 130/70   Pulse 89   Temp 97.7 F (36.5 C)   Ht '5\' 4"'$  (1.626 m)   Wt 264 lb (119.7 kg)   SpO2 96%   BMI 45.32 kg/m     Repeat blood pressure by me was 100/70 supine and 102/70 standing.  Wt Readings from Last 3 Encounters:  01/17/20 264 lb (119.7 kg)  08/02/19 255 lb (115.7 kg)  06/07/19 253 lb 15.5 oz (115.2 kg)    General: Alert, oriented, no distress.  Skin: normal turgor, no rashes, warm and dry HEENT: Normocephalic, atraumatic. Pupils equal round and reactive to light; sclera anicteric; extraocular muscles intact;  Nose without nasal septal hypertrophy Mouth/Parynx benign; Mallinpatti scale 3 Neck: No JVD, no carotid bruits; normal carotid upstroke Lungs: clear to ausculatation and percussion; no wheezing or rales Chest wall: without tenderness to palpitation Heart: PMI not displaced, RRR, s1 s2 normal, 1/6 systolic murmur, no diastolic murmur, no rubs, gallops, thrills, or heaves Abdomen: soft, nontender; no hepatosplenomehaly, BS+; abdominal aorta nontender and not dilated by palpation. Back: no CVA tenderness Pulses 2+ Musculoskeletal: full range of motion, normal strength, no joint deformities Extremities: no clubbing cyanosis or edema, Homan's sign negative  Neurologic: grossly nonfocal; Cranial nerves grossly wnl Psychologic: Normal mood and affect   Studies/Labs Reviewed:   EKG:  EKG is  not  ordered today.  I personally reviewed his most recent ECG from April 04, 2019 which showed normal sinus rhythm at 96 bpm without ectopy.  Recent Labs: BMP Latest Ref  Rng & Units 06/07/2019 04/02/2019 10/29/2018  Glucose 70 - 99 mg/dL 103(H) 105(H) 148(H)  BUN 6 - 20 mg/dL _0 Creatinine 0.61 - 1.24 mg/dL 1.41(H) 1.17 1.21  Sodium 135 - 145 mmol/L 137 134(L) 134(L)  Potassium 3.5 - 5.1 mmol/L 3.9 4.4 3.6  Chloride 98 - 111 mmol/L 103 101 100  CO2 22 - 32 mmol/L _1 Calcium 8.9 - 10.3 mg/dL 9.4 9.3 9.6     Hepatic Function Latest Ref Rng & Units 06/07/2019 04/02/2019 03/07/2019  Total Protein 6.5 - 8.1 g/dL 7.2 6.9 7.2  Albumin 3.5 - 5.0 g/dL 4.1 4.1 4.6  AST 15 - 41 U/L 39 49(H) 25  ALT 0 - 44 U/L 35 36 30  Alk Phosphatase 38 - 126 U/L 49 54 67  Total Bilirubin 0.3 - 1.2 mg/dL 1.2 1.4(H) 0.6  Bilirubin, Direct 0.00 - 0.40 mg/dL - - 0.14    CBC Latest Ref Rng & Units 06/07/2019 04/02/2019 10/29/2018  WBC 4.0 - 10.5 K/uL 4.3 5.4 6.6  Hemoglobin 13.0 - 17.0 g/dL 16.3 16.6 16.1  Hematocrit 39.0 - 52.0 % 50.0 49.6 48.0  Platelets 150 - 400 K/uL 311 293 314   Lab Results  Component Value Date   MCV 92.1 06/07/2019   MCV 89.7 04/02/2019   MCV 88.2 10/29/2018   No results found for: TSH No results found for: HGBA1C   BNP No results found for: BNP  ProBNP No results found for: PROBNP   Lipid Panel     Component Value Date/Time   CHOL 154 03/07/2019 1028   TRIG 156 (H) 03/07/2019 1028   HDL 42 03/07/2019 1028   LDLCALC 81 03/07/2019 1028   LABVLDL 31 03/07/2019 1028     RADIOLOGY: No results found.   Additional studies/ records that were reviewed today include:  I reviewed the patient's sleep study.  I reviewed and obtained his download from January 28 through December 20, 2019.  He was 100% compliant until his machine broke in mid March.  AHI 0.6 at a 95 percentile pressure of 16.2 with maximum average pressure 17.2 centimeters of water  ASSESSMENT:    1.  OSA (obstructive sleep apnea)   2. Essential hypertension   3. Mixed hyperlipidemia   4. Morbid obesity ([place])   5. Palpitations     PLAN:  Mr. Macsen Nuttall is a 39 year old African-American gentleman who was found to have severe obstructive sleep apnea on his diagnostic polysomnogram in January 2020 with an AHI of 37.4/h, RDI of 79.4/h and AHI with supine sleep at 72.9/h.  He was unable to achieve any REM sleep on the diagnostic portion of his study.  Since that time he has been on CPAP therapy and has continued to have excellent compliance.  Apparently, his machine has malfunctioned in mid March 2021 and ultimately had to receive a new machine.  He has had a new machine for several weeks.  We will need to link his new machine to our office.  [facility] is his DME company.  He does note marked improvement in how he feels since initiating CPAP therapy.  In his words "I cannot sleep without it."  He continues to be morbidly obese with a body mass index of 45.32.  He has significantly increased neck circumference at approximately 19 to 20 cm..  On repeat blood pressure by me BP was low at 100/70.  I have recommended he discontinue lisinopril HCT for which she was on  20/25 mg and I will change him to just lisinopril 20 mg daily.  I will give him a prescription for HCTZ 12.5 mg but only take if leg edema develops.  We discussed exercise and weight loss.  I again discussed the benefits of sleep apnea with reference to his cardiovascular health.  He continues to be on simvastatin for hyperlipidemia.  I am checking a chemistry profile CBC lipid studies and TSH.  I will see him in 3 months for reevaluation.   Medication Adjustments/Labs and Tests Ordered: Current medicines are reviewed at length with the patient today.  Concerns regarding medicines are outlined above.  Medication changes, Labs and Tests ordered today are listed in the Patient Instructions below. Patient Instructions  Medication  Instructions:  STOP TAKING YOUR LISINOPRIL-HCTZ  START TAKING LISINOPRIL 20MG DAILY  START TAKING HCTZ 12.5MG AS NEEDED FOR SWELLING  *If you need a refill on your cardiac medications before your next appointment, please call your pharmacy*   Lab Work: FASTING LABS: CMET LIPID TSH CBC  If you have labs (blood work) drawn today and your tests are completely normal, you will receive your results only by: Marland Kitchen MyChart Message (if you have MyChart) OR . A paper copy in the mail If you have any lab test that is abnormal or we need to change your treatment, we will call you to review the results.   Follow-Up: At [facility], you and your health needs are our priority.  As part of our continuing mission to provide you with exceptional heart care, we have created designated Provider Care Teams.  These Care Teams include your primary Cardiologist (physician) and Advanced Practice Providers (APPs -  Physician Assistants and Nurse Practitioners) who all work together to provide you with the care you need, when you need it.  We recommend signing up for the patient portal called "MyChart".  Sign up information is provided on this After Visit Summary.  MyChart is used to connect with patients for Virtual Visits (Telemedicine).  Patients are able to view lab/test results, encounter notes, upcoming appointments, etc.  Non-urgent messages can be sent to your provider as well.   To learn more about what you can do with MyChart, go to [email or web address].    Your next appointment:   3 month(s)  The format for your next appointment:   In Person  Provider:   Shelva Majestic, MD     Signed, Shelva Majestic, MD  01/23/2020 1:18 PM    [facility] [address] [place], [place], [place]  [address] Phone: [phone]

## 2020-01-30 ENCOUNTER — Ambulatory Visit: Payer: Self-pay | Admitting: Internal Medicine

## 2020-02-09 ENCOUNTER — Emergency Department (HOSPITAL_COMMUNITY)
Admission: EM | Admit: 2020-02-09 | Discharge: 2020-02-10 | Disposition: A | Discharge status: Home / Self Care | Payer: Self-pay | Attending: Emergency Medicine | Admitting: Emergency Medicine

## 2020-02-09 ENCOUNTER — Other Ambulatory Visit: Payer: Self-pay

## 2020-02-09 DIAGNOSIS — R519 Headache, unspecified: Secondary | ICD-10-CM | POA: Insufficient documentation

## 2020-02-09 DIAGNOSIS — R42 Dizziness and giddiness: Secondary | ICD-10-CM | POA: Insufficient documentation

## 2020-02-09 DIAGNOSIS — F952 Tourette's disorder: Secondary | ICD-10-CM | POA: Insufficient documentation

## 2020-02-09 DIAGNOSIS — I10 Essential (primary) hypertension: Secondary | ICD-10-CM | POA: Insufficient documentation

## 2020-02-09 DIAGNOSIS — Z79899 Other long term (current) drug therapy: Secondary | ICD-10-CM | POA: Insufficient documentation

## 2020-02-09 LAB — PROTIME-INR
INR: 1.1 (ref 0.8–1.2)
Prothrombin Time: 13.6 seconds (ref 11.4–15.2)

## 2020-02-09 LAB — I-STAT CHEM 8, ED
BUN: 16 mg/dL (ref 6–20)
Calcium, Ion: 1.2 mmol/L (ref 1.15–1.40)
Chloride: 103 mmol/L (ref 98–111)
Creatinine, Ser: 1.2 mg/dL (ref 0.61–1.24)
Glucose, Bld: 107 mg/dL — ABNORMAL HIGH (ref 70–99)
HCT: 51 % (ref 39.0–52.0)
Hemoglobin: 17.3 g/dL — ABNORMAL HIGH (ref 13.0–17.0)
Potassium: 4 mmol/L (ref 3.5–5.1)
Sodium: 142 mmol/L (ref 135–145)
TCO2: 29 mmol/L (ref 22–32)

## 2020-02-09 LAB — COMPREHENSIVE METABOLIC PANEL
ALT: 28 U/L (ref 0–44)
AST: 35 U/L (ref 15–41)
Albumin: 4.1 g/dL (ref 3.5–5.0)
Alkaline Phosphatase: 47 U/L (ref 38–126)
Anion gap: 9 (ref 5–15)
BUN: 12 mg/dL (ref 6–20)
CO2: 25 mmol/L (ref 22–32)
Calcium: 9.3 mg/dL (ref 8.9–10.3)
Chloride: 104 mmol/L (ref 98–111)
Creatinine, Ser: 1.3 mg/dL — ABNORMAL HIGH (ref 0.61–1.24)
GFR calc Af Amer: >60 mL/min (ref >60)
GFR calc non Af Amer: >60 mL/min (ref >60)
Glucose, Bld: 107 mg/dL — ABNORMAL HIGH (ref 70–99)
Potassium: 4 mmol/L (ref 3.5–5.1)
Sodium: 138 mmol/L (ref 135–145)
Total Bilirubin: 1.2 mg/dL (ref 0.3–1.2)
Total Protein: 6.9 g/dL (ref 6.5–8.1)

## 2020-02-09 LAB — CBC
HCT: 51.4 % (ref 39.0–52.0)
Hemoglobin: 16.5 g/dL (ref 13.0–17.0)
MCH: 30.4 pg (ref 26.0–34.0)
MCHC: 32.1 g/dL (ref 30.0–36.0)
MCV: 94.8 fL (ref 80.0–100.0)
Platelets: 287 10*3/uL (ref 150–400)
RBC: 5.42 MIL/uL (ref 4.22–5.81)
RDW: 12.2 % (ref 11.5–15.5)
WBC: 4.5 10*3/uL (ref 4.0–10.5)
nRBC: 0 % (ref 0.0–0.2)

## 2020-02-09 LAB — DIFFERENTIAL
Abs Immature Granulocytes: 0.01 10*3/uL (ref 0.00–0.07)
Basophils Absolute: 0 10*3/uL (ref 0.0–0.1)
Basophils Relative: 1 %
Eosinophils Absolute: 0.1 10*3/uL (ref 0.0–0.5)
Eosinophils Relative: 1 %
Immature Granulocytes: 0 %
Lymphocytes Relative: 44 %
Lymphs Abs: 2 10*3/uL (ref 0.7–4.0)
Monocytes Absolute: 0.5 10*3/uL (ref 0.1–1.0)
Monocytes Relative: 11 %
Neutro Abs: 1.9 10*3/uL (ref 1.7–7.7)
Neutrophils Relative %: 43 %

## 2020-02-09 LAB — APTT: aPTT: 36 seconds (ref 24–36)

## 2020-02-09 MED ORDER — SODIUM CHLORIDE 0.9% FLUSH: 3.0000 mL | Freq: Once | INTRAVENOUS | Status: DC

## 2020-02-09 NOTE — ED Triage Notes (Signed)
Pt here for eval of headache, dizziness, and heaviness in both legs x 1 week. Pt attributed symptoms to dehydration, so he made sure to eat and drink adequately; however symptoms recurred two days later and have continued since then. Pt sts dizziness is worse right when he stands up from sitting.

## 2020-02-10 ENCOUNTER — Encounter (HOSPITAL_COMMUNITY): Payer: Self-pay | Admitting: Emergency Medicine

## 2020-02-10 MED ORDER — SODIUM CHLORIDE 0.9 % IV BOLUS
1000.0000 mL | Freq: Once | INTRAVENOUS | Status: AC
  Administered 2020-02-10: 1000 mL via INTRAVENOUS

## 2020-02-10 MED ORDER — MECLIZINE HCL 25 MG PO TABS
25.0000 mg | ORAL_TABLET | Freq: Three times a day (TID) | ORAL | 0 refills | Status: DC | PRN
Start: 2020-02-10 — End: 2021-05-10

## 2020-02-10 MED ORDER — MECLIZINE HCL 25 MG PO TABS
25.0000 mg | ORAL_TABLET | Freq: Once | ORAL | Status: AC
  Administered 2020-02-10: 25 mg via ORAL
  Filled 2020-02-10: qty 1

## 2020-02-10 NOTE — Discharge Instructions (Signed)
Labs today look good.  Your symptoms may be due to vertigo. Can continue taking the meclizine as needed for dizziness.  Can take this up to 3 times a day.  Continue to drink lots of water to stay hydrated. I recommend that you follow-up closely with your primary care doctor. Please return here for any new or acute changes.

## 2020-02-10 NOTE — ED Provider Notes (Signed)
[facility] EMERGENCY DEPARTMENT Provider Note   CSN: [id number] Arrival date & time: 02/09/20  1428     History Chief Complaint  Patient presents with  . Headache  . Dizziness    Sharrod Achille Hanway is a 39 y.o. male.  The history is provided by the patient and medical records.  Headache Associated symptoms: dizziness   Dizziness Associated symptoms: headaches     39 year old male with history of anxiety, bipolar disorder, depression, hyperlipidemia, hypertension, PTSD, sleep apnea, presenting to the ED with dizziness.  Reports he has had some mild headaches for several months now.  He has discussed this with his primary care doctor on multiple occasions and thought it may be related to sinus congestion.  This is also a chronic problem for him.  He was prescribed Flonase which he has been using without any notable improvement.  He reports episodes of dizziness seem to be getting worse.  He describes this as a sensation of when sitting upright or for standing up that he is moving when he is actually not.  States he feels like when he tries to move his legs, they are both very heavy.  This improves when sitting still.  He has not had any associated nausea or vomiting.  He has not had any focal numbness or weakness.  He is not had any blurred vision.  No history of TIA or stroke.  Is not had any falls or head trauma.  He did see his cardiologist recently and had some medications adjusted but is not noticed any acute change in his symptoms since that occurred.  He does report he drinks a ton of water daily and does not feel he is dehydrated.  Past Medical History:  Diagnosis Date  . Anal fistula   . Anxiety   . Bilateral inguinal hernia 07/2018  . Bipolar disorder (HCC)   . Depression   . Heart palpitations   . Hyperchloremia   . Hyperlipidemia   . Hypertension   . Obesity   . Panic disorder   . PTSD (post-traumatic stress disorder)   . Pulsatile tinnitus     . Sleep apnea    uses CPAP nightly  . Tic disorder   . Tourette disorder   . Vertigo     Patient Active Problem List   Diagnosis Date Noted  . Tic disorder 08/21/2018  . Dizziness 04/13/2018  . Hypertension 03/11/2018  . Hyperlipidemia 03/11/2018  . Episodic mood disorder (HCC) 03/11/2018    History reviewed. No pertinent surgical history.     Family History  Problem Relation Age of Onset  . Drug abuse Mother        crack  . Bipolar disorder Mother   . Heart disease Mother        Carotid artery disease  . Alcohol abuse Father     Social History   Tobacco Use  . Smoking status: Former Smoker    Types: Cigars    Quit date: 11/2018    Years since quitting: 1.2  . Smokeless tobacco: Never Used  Vaping Use  . Vaping Use: Never used  Substance Use Topics  . Alcohol use: Yes    Comment: occasional  . Drug use: Not Currently    Types: Marijuana    Comment: Last use 07/2018    Home Medications Prior to Admission medications   Medication Sig Start Date End Date Taking? Authorizing Provider  acetaminophen (TYLENOL) 500 MG tablet Take 1 tablet (500 mg total)  by mouth every 6 (six) hours as needed. 08/02/19   Fawze, Mina A, PA-C  benzonatate (TESSALON) 100 MG capsule Take 1 capsule (100 mg total) by mouth 3 (three) times daily as needed for cough. 08/02/19   Fawze, Mina A, PA-C  ciprofloxacin (CIPRO) 500 MG tablet Take 1 tablet (500 mg total) by mouth 2 (two) times daily. 05/11/19   Julieanne Manson, MD  gabapentin (NEURONTIN) 100 MG capsule Take 1 capsule (100 mg total) by mouth 3 (three) times daily as needed (burning pain). 05/13/19   Loren Racer, MD  hydrochlorothiazide (MICROZIDE) 12.5 MG capsule Take 1 capsule (12.5 mg total) by mouth as needed. FOR SWELLING 01/17/20 04/16/20  Lennette Bihari, MD  HYDROcodone-acetaminophen (NORCO) 5-325 MG tablet Take 1 tablet by mouth every 6 (six) hours as needed for severe pain. 05/13/19   Loren Racer, MD  hydrOXYzine  (ATARAX/VISTARIL) 25 MG tablet Take 1 tablet (25 mg total) by mouth every 8 (eight) hours as needed for up to 20 doses for anxiety. Patient taking differently: Take 25 mg by mouth daily.  08/16/18   Julieanne Manson, MD  ipratropium (ATROVENT) 0.03 % nasal spray Place 2 sprays into both nostrils 3 (three) times daily as needed for rhinitis. 08/02/19   Fawze, Mina A, PA-C  lisinopril (ZESTRIL) 20 MG tablet Take 1 tablet (20 mg total) by mouth daily. 01/17/20 04/16/20  Lennette Bihari, MD  metroNIDAZOLE (FLAGYL) 500 MG tablet 1 tab by mouth 3 times daily for 10 days 05/11/19   Julieanne Manson, MD  simvastatin (ZOCOR) 40 MG tablet Take 1 tablet (40 mg total) by mouth at bedtime. Patient taking differently: Take 40 mg by mouth daily in the afternoon.  01/18/19   Julieanne Manson, MD  Witch Hazel (PREPARATION H) 50 % PADS Place 1 application rectally as needed (discomfort).     [provider]    Allergies    Motrin [ibuprofen] and Lactose intolerance (gi)  Review of Systems   Review of Systems  Neurological: Positive for dizziness and headaches.  All other systems reviewed and are negative.   Physical Exam Updated Vital Signs BP (!) 150/73 (BP Location: Left Arm)   Pulse 63   Temp 98.1 F (36.7 C) (Oral)   Resp 17   Ht 5\' 4"  (1.626 m)   Wt 115.7 kg   SpO2 99%   BMI 43.77 kg/m   Physical Exam Vitals and nursing note reviewed.  Constitutional:      Appearance: He is well-developed.  HENT:     Head: Normocephalic and atraumatic.  Eyes:     Conjunctiva/sclera: Conjunctivae normal.     Pupils: Pupils are equal, round, and reactive to light.     Comments: EOMs intact, does have a mild horizontal nystagmus, no field cuts  Cardiovascular:     Rate and Rhythm: Normal rate and regular rhythm.     Heart sounds: Normal heart sounds.  Pulmonary:     Effort: Pulmonary effort is normal.     Breath sounds: Normal breath sounds.  Abdominal:     General: Bowel sounds are  normal.     Palpations: Abdomen is soft.  Musculoskeletal:        General: Normal range of motion.     Cervical back: Normal range of motion.  Skin:    General: Skin is warm and dry.  Neurological:     Mental Status: He is alert and oriented to person, place, and time.     Comments: AAOx3, answering questions  and following commands appropriately; equal strength UE and LE bilaterally; CN grossly intact; moves all extremities appropriately without ataxia; no focal neuro deficits or facial asymmetry appreciated, no pronator drift, no dysmetria with finger-nose-finger bilaterally     ED Results / Procedures / Treatments   Labs (all labs ordered are listed, but only abnormal results are displayed) Labs Reviewed  COMPREHENSIVE METABOLIC PANEL - Abnormal; Notable for the following components:      Result Value   Glucose, Bld 107 (*)    Creatinine, Ser 1.30 (*)    All other components within normal limits  I-STAT CHEM 8, ED - Abnormal; Notable for the following components:   Glucose, Bld 107 (*)    Hemoglobin 17.3 (*)    All other components within normal limits  PROTIME-INR  APTT  CBC  DIFFERENTIAL  CBG MONITORING, ED    EKG None  Radiology No results found.  Procedures Procedures (including critical care time)  Medications Ordered in ED Medications  sodium chloride flush (NS) 0.9 % injection 3 mL (has no administration in time range)  sodium chloride 0.9 % bolus 1,000 mL (0 mLs Intravenous Stopped 02/10/20 0415)  meclizine (ANTIVERT) tablet 25 mg (25 mg Oral Given 02/10/20 0231)    ED Course  I have reviewed the triage vital signs and the nursing notes.  Pertinent labs & imaging results that were available during my care of the patient were reviewed by me and considered in my medical decision making (see chart for details).    MDM Rules/Calculators/A&P  39 year old male presenting to the ED with headaches and dizziness.  He has been having headaches for months now.   Has seen times and felt related to nasal congestion.  Been using Flonase without relief.  States dizziness is becoming more frequent.  He describes it as a "movement" even when sitting still.  Symptoms worse when standing up or changing position.  He has not had any focal numbness or weakness.  Patient is awake, alert, oriented to baseline.  His neurologic exam is nonfocal.  He does have a mild horizontal nystagmus on exam.  Question if he has some vertigo.  Given prolonged symptoms without any focal deficits on exam, lower suspicion for CVA or other acute intracranial pathology.  His lab work today is reassuring.  Will give dose of meclizine along with IV fluids and reassess.  4:09 AM Patient feeling better after meclizine and fluids here.  Neurologic exam remains non-focal.  He is asking to be discharged, this seems appropriate.  Will send with script for PRN meclizine.  Encouraged close PCP follow-up, continue good oral hydration.  Return here for any new/acute changes.  Final Clinical Impression(s) / ED Diagnoses Final diagnoses:  Dizziness    Rx / DC Orders ED Discharge Orders         Ordered    meclizine (ANTIVERT) 25 MG tablet  3 times daily PRN     Discontinue  Reprint     02/10/20 0410           Larene Pickett, PA-C 02/10/20 [place], April, MD 02/10/20 [id number]

## 2020-02-10 NOTE — ED Notes (Signed)
Discharge instructions discussed with pt. Pt verbalized understanding with no questions at this time.  

## 2020-03-01 IMAGING — CT CT HEART MORP W/ CTA COR W/ SCORE W/ CA W/CM &/OR W/O CM
4 of 7 series · 8 of 20 positions shown, 9 images · IV contrast (APPLIED)
Comparison: None.

Addendum:
EXAM:
OVER-READ INTERPRETATION  CT CHEST

The following report is an over-read performed by radiologist Dr.
Rtoyota Joshjax [REDACTED] on 07/14/2018. This
over-read does not include interpretation of cardiac or coronary
anatomy or pathology. The coronary calcium score/coronary CTA
interpretation by the cardiologist is attached.
CLINICAL DATA: Chest pain
Cardiac CTA
MEDICATIONS:
Sub lingual nitro. 4 mg and lopressor 10mg
TECHNIQUE: The patient was scanned on a Siemens Force 192 scanner. Gantry
rotation speed was 250 msecs. Collimation was. 6 mm . A 120 kV
prospective scan was triggered in the ascending thoracic aorta at
140 HU's with full mA between 30-70% of the R-R interval . Average
HR during the scan was 74 bpm. The 3D data set was interpreted on a
dedicated work station using MPR, MIP and VRT modes. A total of 80
cc of contrast was used.

[Series 6: best diast 82 % · axial · 0.39mm/px · z∈[+1062,+1109]mm · 2 of 353 slices shown, 3 images]
[im 118/353  vessel]
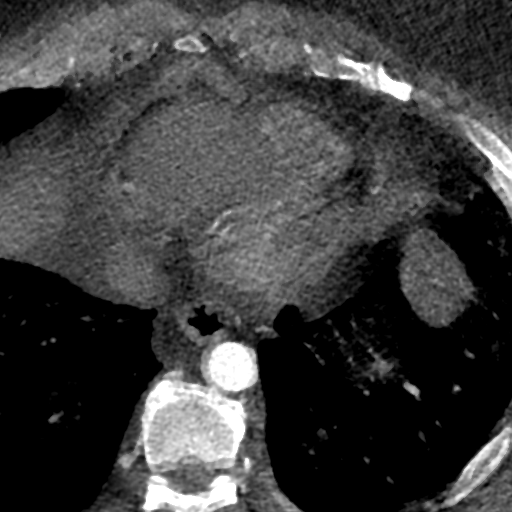
[im 118/353  lung]
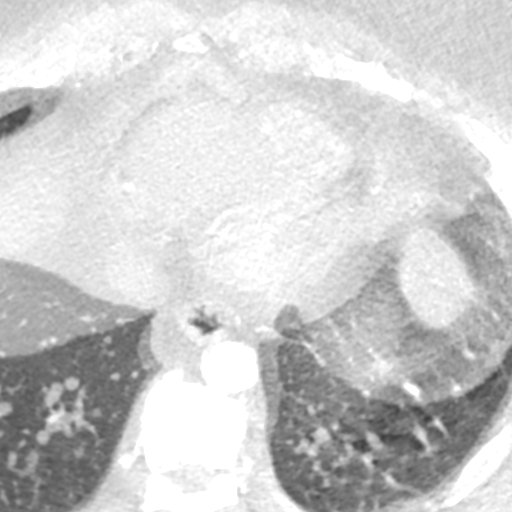
[im 235/353  vessel]
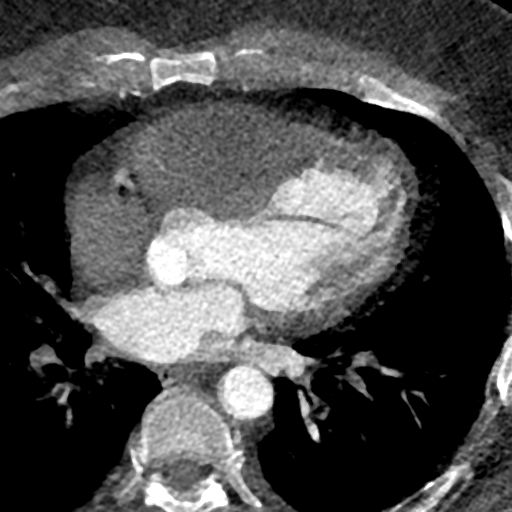

[Series 7: best syst 39 % · axial · 0.39mm/px · z∈[+1062,+1109]mm · 2 of 353 slices shown]
[im 118/353  vessel]
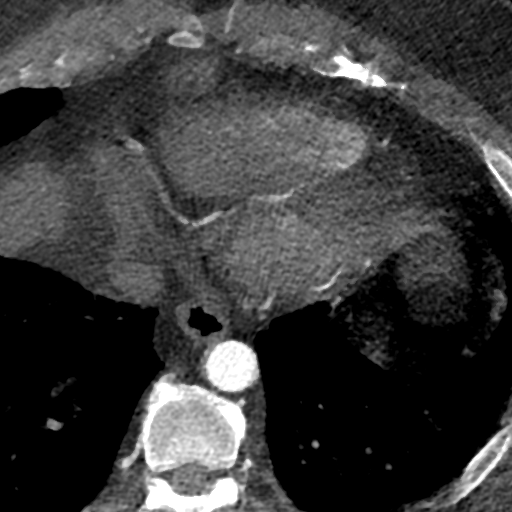
[im 235/353  vessel]
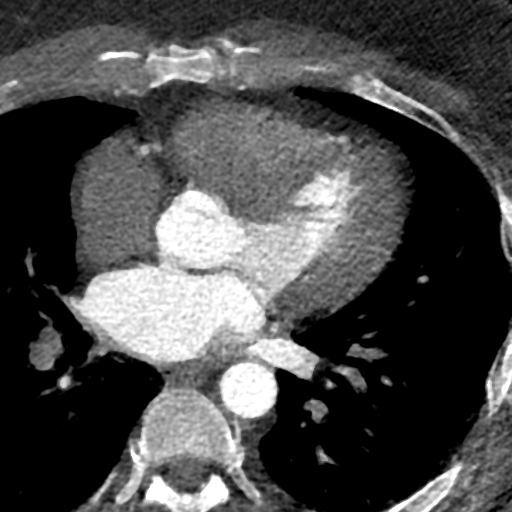

[Series 8: ts diast sharp 82 % · axial · 0.39mm/px · z∈[+1062,+1109]mm · 2 of 353 slices shown]
[im 118/353  lung]
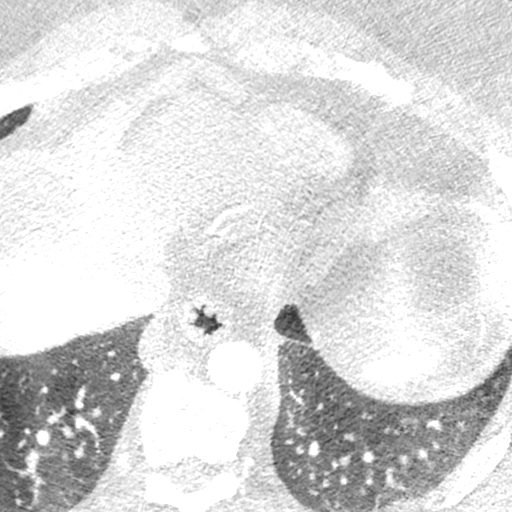
[im 235/353  lung]
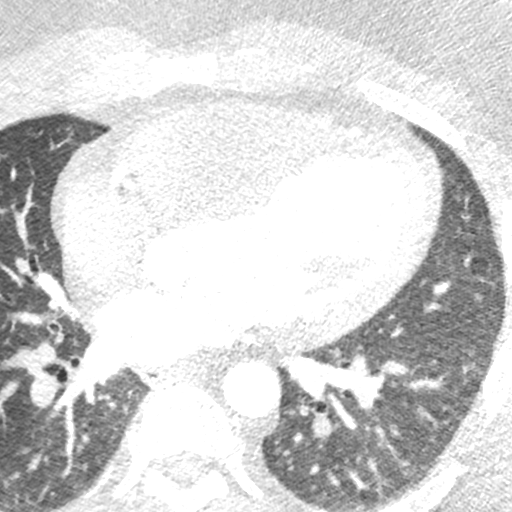

[Series 9: ts syst sharp 39 % · axial · 0.39mm/px · z∈[+1062,+1109]mm · 2 of 353 slices shown]
[im 118/353  lung]
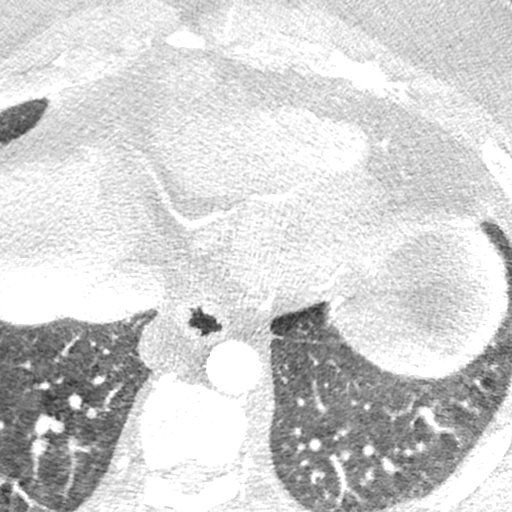
[im 235/353  lung]
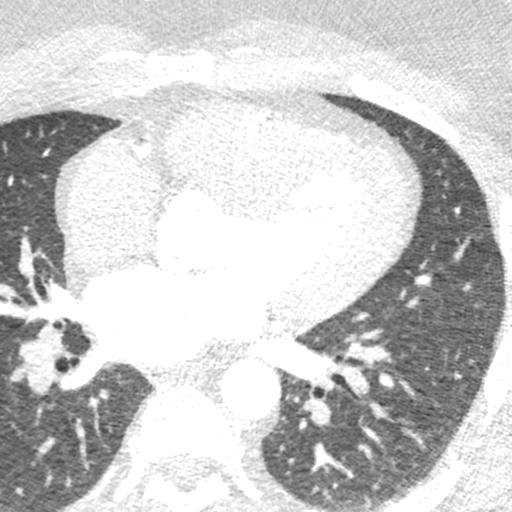

[8 of 20 positions shown; findings below may reference images not displayed]

FINDINGS: Within the visualized portions of the thorax there are no suspicious
appearing pulmonary nodules or masses, there is no acute
consolidative airspace disease, no pleural effusions, no
pneumothorax and no lymphadenopathy. Visualized portions of the
upper abdomen are unremarkable. There are no aggressive appearing
lytic or blastic lesions noted in the visualized portions of the
skeleton.
IMPRESSION: No significant incidental noncardiac findings are noted.
FINDINGS: Non-cardiac: See separate report from [REDACTED]. No
significant findings on limited lung and soft tissue windows.

Calcium score: No calcium noted

Coronary Arteries: Right dominant with no anomalies

LM: Normal

LAD: Normal

D1: Normal

D2: Normal

D3: Normal

Circumflex: Normal

OM1: Normal

OM2: Normal

RCA: Normal

PDA: Normal

PLA: Normal
IMPRESSION: 1. Calcium score 0

2.  Normal coronary arteries

3.  Normal aortic root 3.0 cm

Gilmar Lok

*** End of Addendum ***

## 2020-03-05 ENCOUNTER — Ambulatory Visit: Payer: Self-pay | Admitting: Internal Medicine

## 2020-03-07 ENCOUNTER — Other Ambulatory Visit: Payer: Self-pay | Admitting: Internal Medicine

## 2020-03-14 ENCOUNTER — Telehealth: Payer: Self-pay | Admitting: Internal Medicine

## 2020-03-14 NOTE — Telephone Encounter (Signed)
Patient requesting antibiotic for recurrent fistula inflammation. Patient stated has and abscess and does not want to get blood to get infected with it. Patient will like a call back from Cherice today.

## 2020-03-14 NOTE — Telephone Encounter (Signed)
Patient needs  appointment. Had nicky contact patient and offer for Friday. Patent coming on Friday 03/16/20 @ 9:30 am

## 2020-03-16 ENCOUNTER — Ambulatory Visit: Payer: Self-pay | Admitting: Internal Medicine

## 2020-03-16 ENCOUNTER — Encounter: Payer: Self-pay | Admitting: Internal Medicine

## 2020-04-06 ENCOUNTER — Ambulatory Visit (INDEPENDENT_AMBULATORY_CARE_PROVIDER_SITE_OTHER): Payer: Self-pay | Admitting: Internal Medicine

## 2020-04-06 ENCOUNTER — Encounter: Payer: Self-pay | Admitting: Internal Medicine

## 2020-04-06 ENCOUNTER — Other Ambulatory Visit: Payer: Self-pay

## 2020-04-06 VITALS — BP 128/70 | HR 68 | Resp 12 | Ht 64.0 in | Wt 267.0 lb

## 2020-04-06 DIAGNOSIS — K603 Anal fistula: Secondary | ICD-10-CM

## 2020-04-06 MED ORDER — METRONIDAZOLE 500 MG PO TABS: ORAL_TABLET | ORAL | 0 refills | Status: DC

## 2020-04-06 MED ORDER — CIPROFLOXACIN HCL 500 MG PO TABS
500.0000 mg | ORAL_TABLET | Freq: Two times a day (BID) | ORAL | 0 refills | Status: DC
Start: 2020-04-06 — End: 2021-05-10

## 2020-04-06 NOTE — Progress Notes (Signed)
    Subjective:    Patient ID: Samuel Austin, male   DOB: 09/13/1980, 39 y.o.   MRN: [id number]   HPI   1. Anal fistula--went back up to an abscess.  Opened it with a razor at home and released pus. We had an appt for him prior to him opening it up, but did not show for appt.   He does not have a current orange card for [place] to send a referral to surgery.   Not clear what happened, but he cancelled surgery in January of this year.  States they wanted too much.  Stated they did not have any more spots open for orange card.  Not clear about this as surgery for fistula was in January when orange card spots open up. Having some bloody drainage.  Using some Preparation H wipes to clean and stuff the area. Describes using Lidocaine wipes on the area as well.   Patient also previously cancelled his hernia surgery with Dr. Magnus Ivan.      Current Meds  Medication Sig  . citalopram (CELEXA) 10 MG tablet Take 10 mg by mouth daily.  . hydrOXYzine (ATARAX/VISTARIL) 25 MG tablet Take 1 tablet (25 mg total) by mouth every 8 (eight) hours as needed for up to 20 doses for anxiety. (Patient taking differently: Take 25 mg by mouth daily. )  . lisinopril (ZESTRIL) 20 MG tablet Take 1 tablet (20 mg total) by mouth daily.  . risperiDONE (RISPERDAL) 1 MG tablet Take 1 mg by mouth at bedtime.  . simvastatin (ZOCOR) 40 MG tablet Take 1 tablet (40 mg total) by mouth daily at 6 PM.   Allergies  Allergen Reactions  . Motrin [Ibuprofen] Hives  . Lactose Intolerance (Gi) Diarrhea     Review of Systems    Objective:   BP 128/70 (BP Location: Left Arm, Patient Position: Sitting, Cuff Size: Large)   Pulse 68   Resp 12   Ht 5\' 4"  (1.626 m)   Wt 267 lb (121.1 kg)   BMI 45.83 kg/m   Physical Exam Cardiovascular:     Rate and Rhythm: Normal rate and regular rhythm.     Heart sounds: Normal heart sounds. No murmur heard.  No friction rub. No S3 or S4 sounds.   Pulmonary:     Breath sounds:  Normal breath sounds and air entry.  Genitourinary:       Assessment & Plan   Chronic perianal fistula:  Treat with Cipro/Metronidazole for 10 day course.  No alcohol.  Continue with topical cleansing/keep dry as possible.   Refer back to surgery and make sure this is under [facility].  Discussed likely will not have an opening until January He has orange card application packet.  Encouraged covid vaccination.

## 2020-04-18 ENCOUNTER — Ambulatory Visit: Payer: Self-pay | Admitting: General Surgery

## 2020-04-18 NOTE — H&P (Signed)
) The patient is a 39 year old male who presents with a complaint of anal problems.39 year old male who presents to the office with a seven-year history of recurrent perianal abscesses. These occur every few months. He developed pain and swelling. The area then breaks open and purulent fluid is discharged. The skin and then heals up. He reports regular bowel habits and denies any frequent diarrhea.     Problem List/Past Medical Romie Levee, MD; 04/18/2020 3:01 PM) BILATERAL INGUINAL HERNIA (K40.20) ANAL FISTULA (K60.3)  Past Surgical History Romie Levee, MD; 04/18/2020 3:01 PM) No pertinent past surgical history  Diagnostic Studies History Romie Levee, MD; 04/18/2020 3:01 PM) Colonoscopy never  Allergies Laurette Schimke, RMA; 04/18/2020 2:49 PM) Motrin *ANALGESICS - ANTI-INFLAMMATORY* Allergies Reconciled  Medication History Laurette Schimke, RMA; 04/18/2020 2:51 PM) hydrOXYzine HCl (25MG  Tablet, Oral) Active. Lisinopril-hydroCHLOROthiazide (20-25MG  Tablet, Oral) Active. Simvastatin (40MG  Tablet, Oral) Active. risperiDONE (1MG  Tablet, Oral) Active. Cipro (500MG  Tablet, Oral) Active. Medications Reconciled  Social History , MD; 04/18/2020 3:01 PM) Alcohol use Occasional alcohol use. No caffeine use Tobacco use Former smoker.  Family History , MD; 04/18/2020 3:01 PM) Colon Polyps Family Members In General. Heart Disease Family Members In General, Father. Heart disease in male family member before age 48  Other Problems 04/20/2020, MD; 04/18/2020 3:01 PM) Anxiety Disorder High blood pressure Hypercholesterolemia Inguinal Hernia     Review of Systems 04/20/2020 MD; 04/18/2020 3:01 PM) General Present- Fatigue. Not Present- Appetite Loss, Chills, Fever, Night Sweats, Weight Gain and Weight Loss. Skin Present- Non-Healing Wounds. Not Present- Change in Wart/Mole, Dryness, Hives, Jaundice, New Lesions, Rash and  Ulcer. Gastrointestinal Present- Hemorrhoids. Not Present- Abdominal Pain, Bloating, Bloody Stool, Change in Bowel Habits, Chronic diarrhea, Constipation, Difficulty Swallowing, Excessive gas, Gets full quickly at meals, Indigestion, Nausea, Rectal Pain and Vomiting. Neurological Present- Headaches. Not Present- Decreased Memory, Fainting, Numbness, Seizures, Tingling, Tremor, Trouble walking and Weakness. Psychiatric Present- Anxiety. Not Present- Bipolar, Change in Sleep Pattern, Depression, Fearful and Frequent crying.  Vitals Romie Levee [place] RMA; 04/18/2020 2:52 PM) 04/18/2020 2:51 PM Weight: 266 lb Height: 64in Body Surface Area: 2.21 m Body Mass Index: 45.66 kg/m  Temp.: 97.55F  Pulse: 80 (Regular)  P.OX: 97% (Room air) BP: 120/82(Sitting, Left Arm, Standard)        Physical Exam Misty Stanley MD; 04/18/2020 2:59 PM)  Chest and Lung Exam Chest and lung exam reveals -normal excursion with symmetric chest walls.  Cardiovascular Cardiovascular examination reveals -no digital clubbing, cyanosis, edema, increased warmth or tenderness.  Abdomen Palpation/Percussion Palpation and Percussion of the abdomen reveal - Soft and Non Tender.  Rectal Note: Left anterior midline external opening with no active drainage. Palpable cord noted    Assessment & Plan 04/20/2020 MD; 04/18/2020 3:01 PM)  ANAL FISTULA (K60.3) Impression: -year-old male who presents to the office for evaluation of an anal fistula. He has noted recurrent abscesses for the past 6-7 years. On exam today, he has a left anterior external opening, very close to midline. We discussed that this most likely does represent a fistula. We discussed surgical options include fistulotomy versus seton placement and eventual lift procedure. We discussed if the fistulotomy is performed. There is a small risk of incontinence. If a seton is placed, he will need additional surgery in the future. All questions were  answered. Typical recovery times and recurrence rates were discussed. He is mainly concerned with anesthesia for the procedure due to his sleep apnea. I encouraged him to ask the anesthesiologist  as many questions as needed to alleviate his fears.

## 2020-04-19 ENCOUNTER — Other Ambulatory Visit: Payer: Self-pay

## 2020-04-19 ENCOUNTER — Encounter: Payer: Self-pay | Admitting: Cardiovascular Disease

## 2020-04-19 ENCOUNTER — Ambulatory Visit (INDEPENDENT_AMBULATORY_CARE_PROVIDER_SITE_OTHER): Payer: Self-pay | Admitting: Cardiovascular Disease

## 2020-04-19 VITALS — BP 140/80 | HR 76 | Temp 97.0°F | Ht 64.0 in | Wt 262.0 lb

## 2020-04-19 DIAGNOSIS — G4733 Obstructive sleep apnea (adult) (pediatric): Secondary | ICD-10-CM

## 2020-04-19 DIAGNOSIS — E782 Mixed hyperlipidemia: Secondary | ICD-10-CM

## 2020-04-19 DIAGNOSIS — I10 Essential (primary) hypertension: Secondary | ICD-10-CM

## 2020-04-19 NOTE — Patient Instructions (Signed)
Medication Instructions:  CONTINUE WITH CURRENT MEDICATIONS. NO CHANGES.  *If you need a refill on your cardiac medications before your next appointment, please call your pharmacy*   Lab Work: FASTING LABS:  LIPID If you have labs (blood work) drawn today and your tests are completely normal, you will receive your results only by: Marland Kitchen MyChart Message (if you have MyChart) OR . A paper copy in the mail If you have any lab test that is abnormal or we need to change your treatment, we will call you to review the results.   Follow-Up: At [facility], you and your health needs are our priority.  As part of our continuing mission to provide you with exceptional heart care, we have created designated Provider Care Teams.  These Care Teams include your primary Cardiologist (physician) and Advanced Practice Providers (APPs -  Physician Assistants and Nurse Practitioners) who all work together to provide you with the care you need, when you need it.  We recommend signing up for the patient portal called "MyChart".  Sign up information is provided on this After Visit Summary.  MyChart is used to connect with patients for Virtual Visits (Telemedicine).  Patients are able to view lab/test results, encounter notes, upcoming appointments, etc.  Non-urgent messages can be sent to your provider as well.   To learn more about what you can do with MyChart, go to [email or web address].    Your next appointment:   6 month(s)  The format for your next appointment:   In Person  Provider:   Nicki Guadalajara, MD

## 2020-04-19 NOTE — Progress Notes (Signed)
Cardiology Office Note    Date:  04/21/2020   ID:  Samuel Austin, Samuel Austin 1981/04/13, MRN [id number]  PCP:  Mack Hook, MD  Cardiologist:  Shelva Majestic, MD   31-monthfollow-up sleep evaluation  History of Present Illness:  LLyal Hustedis a 39y.o. male who presents for a 330-monthollow-up sleep evaluation   Mr WhKoperaas a history of anxiety and depression, hypertension, hyperlipidemia, as well as palpitations.  There also was a family history for sleep apnea.  The patient is followed by Dr. MuAmil Austin primary care and has seen Dr. AcMargaretann Austin atypical chest pain.  The patient has been on lisinopril HCT for hypertension, hydroxyzine for anxiety in addition to Risperdal and Celexa.  Due to concerns for obstructive sleep apnea with significant loud snoring, he was referred for a sleep study which was done on August 27, 2018.  This revealed severe sleep apnea with an AHI of 37.4, RDI of 79.4, and AHI with supine sleep at 72.9.  The patient was unable to achieve REM sleep in the diagnostic portion of the study.  CPAP was instituted and he was titrated up to 13 cm where AHI was 4.1.  CPAP auto therapy was recommended and he received a ResMed air sense 10 CPAP auto unit with pressure settings with a minimum of 13 up to a maximum of 20.  I saw him for initial sleep evaluation in a telemedicine visit on December 07, 2018.  Since initiating CPAP therapy, he has noticed remarkable benefit.  He now has significantly more energy.  Previously he was having 3-4 episodes of nocturia per night and this is 0-1 time per night.  Previously he would wake up gasping for breath.  His frequent awakenings have resolved and he is now sleeping most of the night.  He typically sleeps 8 hours per night.  A download was obtained since CPAP initiation from March 15 through December 06, 2018.  He is 100% compliant.  He is averaging 8 hours and 55 minutes of sleep per night.  AHI is excellent at 0.9  with his 95th percentile pressure at 14.8 and maximum pressure at 16.  He has noticed disappearance of prior snoring.  He now feels like he is getting into deeper sleep.  He is using a full facemask.  He states he has been getting supplies on [email or web address] Upon further questions, he was concerned about his lipid status.  He states his primary doctor had taken him off his lovastatin therapy.  He is concerned since laboratory in August 2019 was still elevated.  Total cholesterol was 222, triglycerides 265, HDL 40, VLDL 53, LDL cholesterol 129.  He was advised to improve diet exercise.     He was using CPAP with 100% compliance but his machine malfunction.  As result from mid March he was unable to use CPAP therapy at all until he received a new machine approximately 1 month ago.  Prior to his machine breaking a download from September 21, 2020 had revealed 100% usage until March 17.  He had a CPAP minimum pressure of 13 with maximum pressure of 20 and his 95th percentile pressure was 16.2 with a maximum average pressure of 17.2.    When I last saw him in May 2021 he had received a new machine which has not been linked to our office so I was unable to obtain the most recent download from the new machine.  Presently he goes to bed between 11  PM and midnight.  He is unaware of breakthrough snoring.  Epworth Sleepiness Scale score was calculated in the office today and this endorsed at 15 consistent with residual daytime sleepiness.  Over the last several months he admits to 100% compliance with CPAP therapy and that he "cannot sleep without it.."   Prior to CPAP he was awakening gasping for breath.  Since his last evaluation he had given [place] the serial number of his device but we residual unable to link his machine to our office.  The device number was necessary and this was obtained today with the device number of 606.  As result in the office today we were able to then successfully leak his CPAP therapy to our  office for my review.  A download from July 27 through April 18, 2020 shows 100% compliance with average usage of 8 hours and 21 minutes.  He is set at a minimum pressure of 13 with a maximum pressure of 20.  AHI is excellent at 0.7.  95th percentile pressure is 19.7 with a maximum average pressure of 16.9.  He does have moderate mask leak on most nights.  He has been using a full facemask.  He has not been very successful with weight loss.  He presents for evaluation.  Past Medical History:  Diagnosis Date  . Anal fistula   . Anxiety   . Bilateral inguinal hernia 07/2018  . Bipolar disorder ([place])   . Depression   . Heart palpitations   . Hyperchloremia   . Hyperlipidemia   . Hypertension   . Obesity   . Panic disorder   . PTSD (post-traumatic stress disorder)   . Pulsatile tinnitus   . Sleep apnea    uses CPAP nightly  . Tic disorder   . Tourette disorder   . Vertigo     No past surgical history on file.  Current Medications: Outpatient Medications Prior to Visit  Medication Sig Dispense Refill  . hydrOXYzine (ATARAX/VISTARIL) 25 MG tablet Take 1 tablet (25 mg total) by mouth every 8 (eight) hours as needed for up to 20 doses for anxiety. (Patient taking differently: Take 25 mg by mouth daily. ) 60 tablet 1  . meclizine (ANTIVERT) 25 MG tablet Take 1 tablet (25 mg total) by mouth 3 (three) times daily as needed for dizziness. 30 tablet 0  . metroNIDAZOLE (FLAGYL) 500 MG tablet 1 tab by mouth 3 times daily for 10 days 30 tablet 0  . risperiDONE (RISPERDAL) 1 MG tablet Take 1 mg by mouth at bedtime.    . simvastatin (ZOCOR) 40 MG tablet Take 1 tablet (40 mg total) by mouth daily at 6 PM. 30 tablet 10  . Witch Hazel (PREPARATION H) 50 % PADS Place 1 application rectally as needed (discomfort).     . ciprofloxacin (CIPRO) 500 MG tablet Take 1 tablet (500 mg total) by mouth 2 (two) times daily. 20 tablet 0  . citalopram (CELEXA) 10 MG tablet Take 10 mg by mouth daily.    .  hydrochlorothiazide (MICROZIDE) 12.5 MG capsule Take 1 capsule (12.5 mg total) by mouth as needed. FOR SWELLING (Patient not taking: Reported on 04/06/2020) 90 capsule 3  . lisinopril (ZESTRIL) 20 MG tablet Take 1 tablet (20 mg total) by mouth daily. 90 tablet 3  . acetaminophen (TYLENOL) 500 MG tablet Take 1 tablet (500 mg total) by mouth every 6 (six) hours as needed. 30 tablet 0  . benzonatate (TESSALON) 100 MG capsule Take 1 capsule (100  mg total) by mouth 3 (three) times daily as needed for cough. (Patient not taking: Reported on 04/06/2020) 21 capsule 0  . gabapentin (NEURONTIN) 100 MG capsule Take 1 capsule (100 mg total) by mouth 3 (three) times daily as needed (burning pain). (Patient not taking: Reported on 04/06/2020) 30 capsule 0  . HYDROcodone-acetaminophen (NORCO) 5-325 MG tablet Take 1 tablet by mouth every 6 (six) hours as needed for severe pain. (Patient not taking: Reported on 04/06/2020) 10 tablet 0  . ipratropium (ATROVENT) 0.03 % nasal spray Place 2 sprays into both nostrils 3 (three) times daily as needed for rhinitis. (Patient not taking: Reported on 04/06/2020) 30 mL 12   No facility-administered medications prior to visit.     Allergies:   Motrin [ibuprofen] and Lactose intolerance (gi)   Social History   Socioeconomic History  . Marital status: Single    Spouse name: Not on file  . Number of children: Not on file  . Years of education: Not on file  . Highest education level: Not on file  Occupational History  . Not on file  Tobacco Use  . Smoking status: Former Smoker    Types: Cigars    Quit date: 11/2018    Years since quitting: 1.4  . Smokeless tobacco: Never Used  Vaping Use  . Vaping Use: Never used  Substance and Sexual Activity  . Alcohol use: Yes    Comment: occasional  . Drug use: Not Currently    Types: Marijuana    Comment: Last use 07/2018  . Sexual activity: Not on file  Other Topics Concern  . Not on file  Social History Narrative   Lives  with a male friend   Mother moved to [place] from [place] in recent years.    No children.  Thought he had a daughter, but DNA showed he was not her father.   Social Determinants of Health   Financial Resource Strain:   . Difficulty of Paying Living Expenses: Not on file  Food Insecurity:   . Worried About Charity fundraiser in the Last Year: Not on file  . Ran Out of Food in the Last Year: Not on file  Transportation Needs:   . Lack of Transportation (Medical): Not on file  . Lack of Transportation (Non-Medical): Not on file  Physical Activity:   . Days of Exercise per Week: Not on file  . Minutes of Exercise per Session: Not on file  Stress:   . Feeling of Stress : Not on file  Social Connections:   . Frequency of Communication with Friends and Family: Not on file  . Frequency of Social Gatherings with Friends and Family: Not on file  . Attends Religious Services: Not on file  . Active Member of Clubs or Organizations: Not on file  . Attends Archivist Meetings: Not on file  . Marital Status: Not on file     Family History:  The patient's family history includes Alcohol abuse in his father; Bipolar disorder in his mother; Drug abuse in his mother; Heart disease in his mother.   ROS General: Negative; No fevers, chills, or night sweats;  HEENT: Negative; No changes in vision or hearing, sinus congestion, difficulty swallowing Pulmonary: Negative; No cough, wheezing, shortness of breath, hemoptysis Cardiovascular: Negative; No chest pain, presyncope, syncope, palpitations GI: Negative; No nausea, vomiting, diarrhea, or abdominal pain GU: Negative; No dysuria, hematuria, or difficulty voiding Musculoskeletal: Negative; no myalgias, joint pain, or weakness Hematologic/Oncology: Negative; no easy  bruising, bleeding Endocrine: Negative; no heat/cold intolerance; no diabetes Neuro: Negative; no changes in balance, headaches Skin: Negative; No rashes or skin  lesions Psychiatric: Negative; No behavioral problems, depression Sleep: Negative; No snoring, daytime sleepiness, hypersomnolence, bruxism, restless legs, hypnogognic hallucinations, no cataplexy Other comprehensive 14 point system review is negative.   PHYSICAL EXAM:   VS:  BP 140/80   Pulse 76   Temp (!) 97 F (36.1 C)   Ht '5\' 4"'  (1.626 m)   Wt 262 lb (118.8 kg)   SpO2 96%   BMI 44.97 kg/m     Repeat blood pressure by me was 128/80  Wt Readings from Last 3 Encounters:  04/19/20 262 lb (118.8 kg)  04/06/20 267 lb (121.1 kg)  02/09/20 255 lb (115.7 kg)    General: Alert, oriented, no distress.  Skin: normal turgor, no rashes, warm and dry HEENT: Normocephalic, atraumatic. Pupils equal round and reactive to light; sclera anicteric; extraocular muscles intact;  Nose without nasal septal hypertrophy Mouth/Parynx benign; Mallinpatti scale 4 Neck: No JVD, no carotid bruits; normal carotid upstroke Lungs: clear to ausculatation and percussion; no wheezing or rales Chest wall: without tenderness to palpitation Heart: PMI not displaced, RRR, s1 s2 normal, 1/6 systolic murmur, no diastolic murmur, no rubs, gallops, thrills, or heaves Abdomen: Central adiposity soft, nontender; no hepatosplenomehaly, BS+; abdominal aorta nontender and not dilated by palpation. Back: no CVA tenderness Pulses 2+ Musculoskeletal: full range of motion, normal strength, no joint deformities Extremities: no clubbing cyanosis or edema, Homan's sign negative  Neurologic: grossly nonfocal; Cranial nerves grossly wnl Psychologic: Normal mood and affect   Studies/Labs Reviewed:   EKG:  EKG is not  ordered today.  I personally reviewed his most recent ECG from April 04, 2019 which showed normal sinus rhythm at 96 bpm without ectopy.  Recent Labs: BMP Latest Ref Rng & Units 02/09/2020 02/09/2020 06/07/2019  Glucose 70 - 99 mg/dL 107(H) 107(H) 103(H)  BUN 6 - 20 mg/dL '16 12 14  ' Creatinine 0.61 - 1.24 mg/dL  1.20 1.30(H) 1.41(H)  Sodium 135 - 145 mmol/L 142 138 137  Potassium 3.5 - 5.1 mmol/L 4.0 4.0 3.9  Chloride 98 - 111 mmol/L 103 104 103  CO2 22 - 32 mmol/L - 25 22  Calcium 8.9 - 10.3 mg/dL - 9.3 9.4     Hepatic Function Latest Ref Rng & Units 02/09/2020 06/07/2019 04/02/2019  Total Protein 6.5 - 8.1 g/dL 6.9 7.2 6.9  Albumin 3.5 - 5.0 g/dL 4.1 4.1 4.1  AST 15 - 41 U/L 35 39 49(H)  ALT 0 - 44 U/L 28 35 36  Alk Phosphatase 38 - 126 U/L 47 49 54  Total Bilirubin 0.3 - 1.2 mg/dL 1.2 1.2 1.4(H)  Bilirubin, Direct 0.00 - 0.40 mg/dL - - -    CBC Latest Ref Rng & Units 02/09/2020 02/09/2020 06/07/2019  WBC 4.0 - 10.5 K/uL - 4.5 4.3  Hemoglobin 13.0 - 17.0 g/dL 17.3(H) 16.5 16.3  Hematocrit 39 - 52 % 51.0 51.4 50.0  Platelets 150 - 400 K/uL - 287 311   Lab Results  Component Value Date   MCV 94.8 02/09/2020   MCV 92.1 06/07/2019   MCV 89.7 04/02/2019   No results found for: TSH No results found for: HGBA1C   BNP No results found for: BNP  ProBNP No results found for: PROBNP   Lipid Panel     Component Value Date/Time   CHOL 154 03/07/2019 1028   TRIG 156 (H) 03/07/2019 1028  HDL 42 03/07/2019 1028   LDLCALC 81 03/07/2019 1028   LABVLDL 31 03/07/2019 1028     RADIOLOGY: No results found.   Additional studies/ records that were reviewed today include:  I reviewed the patient's sleep study.  I reviewed and obtained his download from January 28 through December 20, 2019.  He was 100% compliant until his machine broke in mid March.  AHI 0.6 at a 95 percentile pressure of 16.2 with maximum average pressure 17.2 centimeters of water  His machine was ultimately linked to our office today and a download was achieved from July 27 through April 18, 2020 which was reviewed with the patient.  ASSESSMENT:    1. OSA (obstructive sleep apnea)   2. Essential hypertension   3. Mixed hyperlipidemia   4. Morbid obesity [facility])     PLAN:  Mr. Wells Mabe is a 39 year old  African-American gentleman who has a history of morbid obesity and was found to have severe obstructive sleep apnea on his diagnostic polysomnogram in January 2020 with an AHI of 37.4/h, RDI of 79.4/h and AHI with supine sleep at 72.9/h.  He was unable to achieve any REM sleep on the diagnostic portion of his study.  Since that time he has been on CPAP therapy and has continued to have excellent compliance.  Apparently, his machine has malfunctioned in mid March 2021 and ultimately had to receive a new machine.  Since reinitiating therapy he admits to 100% compliance.  He "cannot sleep without it."  Previously he had significant episodes of gasping for breath, nonrestorative sleep, frequent awakenings, daytime sleepiness, loud snoring and morning headaches. Finally able to link his device to her office and his download confirms 100% compliance with excellent benefit with an AHI of 0.7 at his current pressure settings.  His 95th percentile pressure is 15.7cm of water.  His blood pressure today is improved and he continues to be on hydrochlorothiazide 12.5 mg as needed for swelling, lisinopril 20 mg daily.  He is on simvastatin 40 mg for hyperlipidemia.  He continues to be morbidly obese.  We discussed the importance of weight loss.  We discussed the importance of exercising at least 5 days/week with moderate intensity for at least 30 minutes if at all possible.  I have recommended follow-up lipid panel be obtained to reassess lipid studies which were last checked in July 2020.  Recent laboratory in June revealed a creatinine of 1.2 and BUN of 16 current therapy.  I will see him in 6 months for reevaluation.  Medication Adjustments/Labs and Tests Ordered: Current medicines are reviewed at length with the patient today.  Concerns regarding medicines are outlined above.  Medication changes, Labs and Tests ordered today are listed in the Patient Instructions below. Patient Instructions  Medication Instructions:   CONTINUE WITH CURRENT MEDICATIONS. NO CHANGES.  *If you need a refill on your cardiac medications before your next appointment, please call your pharmacy*   Lab Work: FASTING LABS:  LIPID If you have labs (blood work) drawn today and your tests are completely normal, you will receive your results only by: Marland Kitchen MyChart Message (if you have MyChart) OR . A paper copy in the mail If you have any lab test that is abnormal or we need to change your treatment, we will call you to review the results.   Follow-Up: At [facility], you and your health needs are our priority.  As part of our continuing mission to provide you with exceptional heart care, we have  created designated Provider Care Teams.  These Care Teams include your primary Cardiologist (physician) and Advanced Practice Providers (APPs -  Physician Assistants and Nurse Practitioners) who all work together to provide you with the care you need, when you need it.  We recommend signing up for the patient portal called "MyChart".  Sign up information is provided on this After Visit Summary.  MyChart is used to connect with patients for Virtual Visits (Telemedicine).  Patients are able to view lab/test results, encounter notes, upcoming appointments, etc.  Non-urgent messages can be sent to your provider as well.   To learn more about what you can do with MyChart, go to [email or web address].    Your next appointment:   6 month(s)  The format for your next appointment:   In Person  Provider:   Shelva Majestic, MD       Signed, Shelva Majestic, MD  04/21/2020 4:09 PM    [facility] [address] [place], [place], [place]  [address] Phone: [phone]

## 2020-04-20 NOTE — Telephone Encounter (Signed)
Able to add pt into [employer].

## 2020-04-21 ENCOUNTER — Encounter: Payer: Self-pay | Admitting: Cardiovascular Disease

## 2020-06-28 ENCOUNTER — Ambulatory Visit (HOSPITAL_COMMUNITY): Payer: Self-pay | Admitting: Psychiatry

## 2020-09-03 ENCOUNTER — Other Ambulatory Visit: Payer: Self-pay

## 2020-09-03 ENCOUNTER — Telehealth (HOSPITAL_COMMUNITY): Payer: No Payment, Other

## 2020-09-13 ENCOUNTER — Other Ambulatory Visit: Payer: Self-pay | Admitting: Internal Medicine

## 2020-10-08 ENCOUNTER — Encounter (HOSPITAL_BASED_OUTPATIENT_CLINIC_OR_DEPARTMENT_OTHER): Payer: Self-pay | Admitting: *Deleted

## 2020-10-08 ENCOUNTER — Emergency Department (HOSPITAL_BASED_OUTPATIENT_CLINIC_OR_DEPARTMENT_OTHER)
Admission: EM | Admit: 2020-10-08 | Discharge: 2020-10-08 | Disposition: A | Discharge status: Home / Self Care | Payer: Self-pay | Attending: Emergency Medicine | Admitting: Emergency Medicine

## 2020-10-08 ENCOUNTER — Other Ambulatory Visit: Payer: Self-pay

## 2020-10-08 DIAGNOSIS — Z87891 Personal history of nicotine dependence: Secondary | ICD-10-CM | POA: Insufficient documentation

## 2020-10-08 DIAGNOSIS — T148XXA Other injury of unspecified body region, initial encounter: Secondary | ICD-10-CM

## 2020-10-08 DIAGNOSIS — Z79899 Other long term (current) drug therapy: Secondary | ICD-10-CM | POA: Insufficient documentation

## 2020-10-08 DIAGNOSIS — Y92410 Unspecified street and highway as the place of occurrence of the external cause: Secondary | ICD-10-CM | POA: Insufficient documentation

## 2020-10-08 DIAGNOSIS — M542 Cervicalgia: Secondary | ICD-10-CM | POA: Insufficient documentation

## 2020-10-08 DIAGNOSIS — S29012A Strain of muscle and tendon of back wall of thorax, initial encounter: Secondary | ICD-10-CM | POA: Insufficient documentation

## 2020-10-08 DIAGNOSIS — I10 Essential (primary) hypertension: Secondary | ICD-10-CM | POA: Insufficient documentation

## 2020-10-08 MED ORDER — CYCLOBENZAPRINE HCL 10 MG PO TABS
10.0000 mg | ORAL_TABLET | Freq: Every day | ORAL | 0 refills | Status: AC
Start: 2020-10-08 — End: 2020-10-15

## 2020-10-08 NOTE — ED Notes (Signed)
ED Provider at bedside. 
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
## 2020-10-08 NOTE — Discharge Instructions (Signed)
I recommend a combination of tylenol and ibuprofen for management of your pain. You can take a low dose of both at the same time. I recommend 325 mg of Tylenol combined with 400 mg of ibuprofen. This is one regular Tylenol and two regular ibuprofen. You can take these 2-3 times for day for your pain. Please try to take these medications with a small amount of food as well to prevent upsetting your stomach.  Also, please consider topical pain relieving creams such as Voltaran Gel, BioFreeze, or Icy Hot. There is also a pain relieving cream made by [employer]. You should be able to find all of these at your local pharmacy.   I am prescribing you a strong muscle relaxer called flexeril. Please only take this medication once in the evening with dinner. This medication can make you quite drowsy. Do not mix it with alcohol. Do not drive a vehicle after taking it.   Please return to the ER with any new or worsening symptoms.  It was a pleasure to meet you. 

## 2020-10-09 ENCOUNTER — Ambulatory Visit: Payer: Self-pay | Admitting: Internal Medicine

## 2020-10-22 ENCOUNTER — Ambulatory Visit: Payer: Self-pay | Admitting: Cardiovascular Disease

## 2020-12-12 ENCOUNTER — Telehealth: Payer: Self-pay | Admitting: Internal Medicine

## 2020-12-12 NOTE — Telephone Encounter (Signed)
Patient's attorney called asking to send the documents they requested through a release of information. We were not able to locate such document so I requested Gwynneth Munson [phone] to resend the document to [place].

## 2020-12-25 ENCOUNTER — Other Ambulatory Visit: Payer: Self-pay

## 2020-12-25 ENCOUNTER — Telehealth: Payer: Self-pay | Admitting: Cardiovascular Disease

## 2020-12-25 MED ORDER — LISINOPRIL 20 MG PO TABS: 20.0000 mg | ORAL_TABLET | Freq: Every day | ORAL | 0 refills | Status: DC

## 2020-12-25 MED ORDER — SIMVASTATIN 40 MG PO TABS: 40.0000 mg | ORAL_TABLET | Freq: Every day | ORAL | 3 refills | Status: DC

## 2020-12-25 NOTE — Telephone Encounter (Signed)
*  STAT* If patient is at the pharmacy, call can be transferred to refill team.   1. Which medications need to be refilled? (please list name of each medication and dose if known)  lisinopril (ZESTRIL) 20 MG tablet(Expired); simvastatin (ZOCOR) 40 MG tablet  2. Which pharmacy/location (including street and city if local pharmacy) is medication to be sent to?  [facility] - [place] ([place]), [place] - [address]  3. Do they need a 30 day or 90 day supply?  90 day lisinopril (ZESTRIL) 20 MG tablet(Expired); 30 day simvastatin (ZOCOR) 40 MG tablet

## 2020-12-25 NOTE — Telephone Encounter (Signed)
RX sent to pharmacy- enough RX to get to next appointment in August.

## 2021-03-01 ENCOUNTER — Ambulatory Visit: Payer: Self-pay | Admitting: Internal Medicine

## 2021-03-08 ENCOUNTER — Other Ambulatory Visit: Payer: Self-pay

## 2021-03-08 ENCOUNTER — Encounter (HOSPITAL_COMMUNITY): Payer: Self-pay | Admitting: *Deleted

## 2021-03-08 ENCOUNTER — Emergency Department (HOSPITAL_COMMUNITY)
Admission: EM | Admit: 2021-03-08 | Discharge: 2021-03-08 | Disposition: A | Discharge status: Home / Self Care | Payer: Self-pay | Attending: Emergency Medicine | Admitting: Emergency Medicine

## 2021-03-08 ENCOUNTER — Emergency Department (HOSPITAL_COMMUNITY): Payer: Self-pay

## 2021-03-08 DIAGNOSIS — R42 Dizziness and giddiness: Secondary | ICD-10-CM

## 2021-03-08 DIAGNOSIS — E860 Dehydration: Secondary | ICD-10-CM | POA: Insufficient documentation

## 2021-03-08 DIAGNOSIS — R0602 Shortness of breath: Secondary | ICD-10-CM | POA: Insufficient documentation

## 2021-03-08 DIAGNOSIS — T675XXA Heat exhaustion, unspecified, initial encounter: Secondary | ICD-10-CM | POA: Insufficient documentation

## 2021-03-08 DIAGNOSIS — Z79899 Other long term (current) drug therapy: Secondary | ICD-10-CM | POA: Insufficient documentation

## 2021-03-08 DIAGNOSIS — R079 Chest pain, unspecified: Secondary | ICD-10-CM | POA: Insufficient documentation

## 2021-03-08 DIAGNOSIS — Z87891 Personal history of nicotine dependence: Secondary | ICD-10-CM | POA: Insufficient documentation

## 2021-03-08 DIAGNOSIS — I10 Essential (primary) hypertension: Secondary | ICD-10-CM | POA: Insufficient documentation

## 2021-03-08 LAB — CBC WITH DIFFERENTIAL/PLATELET
Abs Immature Granulocytes: 0.01 10*3/uL (ref 0.00–0.07)
Basophils Absolute: 0 10*3/uL (ref 0.0–0.1)
Basophils Relative: 1 %
Eosinophils Absolute: 0.1 10*3/uL (ref 0.0–0.5)
Eosinophils Relative: 2 %
HCT: 51.1 % (ref 39.0–52.0)
Hemoglobin: 16.8 g/dL (ref 13.0–17.0)
Immature Granulocytes: 0 %
Lymphocytes Relative: 36 %
Lymphs Abs: 1.7 10*3/uL (ref 0.7–4.0)
MCH: 30.7 pg (ref 26.0–34.0)
MCHC: 32.9 g/dL (ref 30.0–36.0)
MCV: 93.4 fL (ref 80.0–100.0)
Monocytes Absolute: 0.5 10*3/uL (ref 0.1–1.0)
Monocytes Relative: 10 %
Neutro Abs: 2.4 10*3/uL (ref 1.7–7.7)
Neutrophils Relative %: 51 %
Platelets: 277 10*3/uL (ref 150–400)
RBC: 5.47 MIL/uL (ref 4.22–5.81)
RDW: 12.6 % (ref 11.5–15.5)
WBC: 4.6 10*3/uL (ref 4.0–10.5)
nRBC: 0 % (ref 0.0–0.2)

## 2021-03-08 LAB — TROPONIN I (HIGH SENSITIVITY)
Troponin I (High Sensitivity): 8 ng/L (ref <18)
Troponin I (High Sensitivity): 9 ng/L (ref <18)

## 2021-03-08 LAB — BASIC METABOLIC PANEL
Anion gap: 7 (ref 5–15)
BUN: 11 mg/dL (ref 6–20)
CO2: 26 mmol/L (ref 22–32)
Calcium: 9.5 mg/dL (ref 8.9–10.3)
Chloride: 103 mmol/L (ref 98–111)
Creatinine, Ser: 1.24 mg/dL (ref 0.61–1.24)
GFR, Estimated: >60 mL/min (ref >60)
Glucose, Bld: 84 mg/dL (ref 70–99)
Potassium: 3.9 mmol/L (ref 3.5–5.1)
Sodium: 136 mmol/L (ref 135–145)

## 2021-03-08 MED ORDER — SODIUM CHLORIDE 0.9 % IV BOLUS: 1000.0000 mL | Freq: Once | INTRAVENOUS | Status: DC

## 2021-03-08 NOTE — ED Provider Notes (Signed)
[facility] EMERGENCY DEPARTMENT Provider Note   CSN: [id number] Arrival date & time: 03/08/21  1637     History Chief Complaint  Patient presents with   Dizziness    Samuel Austin is a 40 y.o. male history of hypertension, PTSD, obesity here presenting with shortness of breath, dizziness, diaphoresis.  Patient states that today he took his blood pressure medicine and did not he much food.  He states that he is trying to lose weight so decided to go out for a walk.  He states that he walked uphill for about a mile and then came back and started getting short of breath.  He also feels lightheaded and dizzy as well.  He did not actually pass out or have any trouble speaking or focal weakness.  He was concerned about his symptoms so came here for further evaluation.   The history is provided by the patient.      Past Medical History:  Diagnosis Date   Anal fistula    Anxiety    Bilateral inguinal hernia 07/2018   Bipolar disorder (HCC)    Depression    Heart palpitations    Hyperchloremia    Hyperlipidemia    Hypertension    Obesity    Panic disorder    PTSD (post-traumatic stress disorder)    Pulsatile tinnitus    Sleep apnea    uses CPAP nightly   Tic disorder    Tourette disorder    Vertigo     Patient Active Problem List   Diagnosis Date Noted   Tic disorder 08/21/2018   Dizziness 04/13/2018   Hypertension 03/11/2018   Hyperlipidemia 03/11/2018   Episodic mood disorder (HCC) 03/11/2018    History reviewed. No pertinent surgical history.     Family History  Problem Relation Age of Onset   Drug abuse Mother        crack   Bipolar disorder Mother    Heart disease Mother        Carotid artery disease   Alcohol abuse Father     Social History   Tobacco Use   Smoking status: Former    Types: Cigars    Quit date: 11/2018    Years since quitting: 2.2   Smokeless tobacco: Never  Vaping Use   Vaping Use: Never used   Substance Use Topics   Alcohol use: Yes    Comment: occasional   Drug use: Not Currently    Types: Marijuana    Comment: Last use 07/2018    Home Medications Prior to Admission medications   Medication Sig Start Date End Date Taking? Authorizing Provider  ciprofloxacin (CIPRO) 500 MG tablet Take 1 tablet (500 mg total) by mouth 2 (two) times daily. 04/06/20   Julieanne Manson, MD  citalopram (CELEXA) 10 MG tablet Take 10 mg by mouth daily.    [provider]  hydrochlorothiazide (MICROZIDE) 12.5 MG capsule Take 1 capsule (12.5 mg total) by mouth as needed. FOR SWELLING Patient not taking: Reported on 04/06/2020 01/17/20 04/16/20  Lennette Bihari, MD  hydrOXYzine (ATARAX/VISTARIL) 25 MG tablet Take 1 tablet (25 mg total) by mouth every 8 (eight) hours as needed for up to 20 doses for anxiety. Patient taking differently: Take 25 mg by mouth daily.  08/16/18   Julieanne Manson, MD  lisinopril (ZESTRIL) 20 MG tablet Take 1 tablet (20 mg total) by mouth daily. 12/25/20 03/25/21  Lennette Bihari, MD  meclizine (ANTIVERT) 25 MG tablet Take 1 tablet (  25 mg total) by mouth 3 (three) times daily as needed for dizziness. 02/10/20   Garlon Hatchet, PA-C  metroNIDAZOLE (FLAGYL) 500 MG tablet 1 tab by mouth 3 times daily for 10 days 04/06/20   Julieanne Manson, MD  risperiDONE (RISPERDAL) 1 MG tablet Take 1 mg by mouth at bedtime.    [provider]  simvastatin (ZOCOR) 40 MG tablet Take 1 tablet (40 mg total) by mouth daily at 6 PM. 12/25/20   Lennette Bihari, MD  Witch Hazel (PREPARATION H) 50 % PADS Place 1 application rectally as needed (discomfort).     [provider]    Allergies    Motrin [ibuprofen] and Lactose intolerance (gi)  Review of Systems   Review of Systems  Neurological:  Positive for dizziness.  All other systems reviewed and are negative.  Physical Exam Updated Vital Signs BP 99/86 (BP Location: Right Wrist)   Pulse 72   Temp 98.2 F (36.8 C)  (Oral)   Resp 18   Ht 5\' 4"  (1.626 m)   Wt 118.8 kg   SpO2 100%   BMI 44.96 kg/m   Physical Exam Vitals and nursing note reviewed.  Constitutional:      Appearance: Normal appearance.  HENT:     Head: Normocephalic.     Nose: Nose normal.     Mouth/Throat:     Mouth: Mucous membranes are dry.  Eyes:     Extraocular Movements: Extraocular movements intact.     Pupils: Pupils are equal, round, and reactive to light.  Cardiovascular:     Rate and Rhythm: Normal rate and regular rhythm.     Pulses: Normal pulses.     Heart sounds: Normal heart sounds.  Pulmonary:     Effort: Pulmonary effort is normal.     Breath sounds: Normal breath sounds.  Abdominal:     General: Abdomen is flat.     Palpations: Abdomen is soft.  Musculoskeletal:        General: Normal range of motion.     Cervical back: Normal range of motion and neck supple.  Skin:    General: Skin is warm.     Capillary Refill: Capillary refill takes less than 2 seconds.  Neurological:     General: No focal deficit present.     Mental Status: He is alert and oriented to person, place, and time.     Cranial Nerves: No cranial nerve deficit.     Sensory: No sensory deficit.     Motor: No weakness.     Coordination: Coordination normal.     Comments: Cranial nerves II to XII intact.  Patient has normal strength and sensation bilateral arms and legs.  Patient has normal finger-to-nose bilaterally.  No pronator drift.  Normal gait  Psychiatric:        Mood and Affect: Mood normal.        Behavior: Behavior normal.    ED Results / Procedures / Treatments   Labs (all labs ordered are listed, but only abnormal results are displayed) Labs Reviewed  BASIC METABOLIC PANEL  CBC WITH DIFFERENTIAL/PLATELET  TROPONIN I (HIGH SENSITIVITY)  TROPONIN I (HIGH SENSITIVITY)    EKG EKG Interpretation  Date/Time:  Friday March 08 2021 17:40:11 EDT Ventricular Rate:  71 PR Interval:  178 QRS Duration: 100 QT  Interval:  384 QTC Calculation: 417 R Axis:   30 Text Interpretation: Normal sinus rhythm Normal ECG No significant change since last tracing Confirmed by [id number]  H [phone]) on 03/08/2021 9:11:04 PM  Radiology DG Chest 2 View  Result Date: 03/08/2021 CLINICAL DATA:  Shortness of breath and dizziness. EXAM: CHEST - 2 VIEW COMPARISON:  Chest x-ray dated June 07, 2019. FINDINGS: Unchanged mild cardiomegaly. Normal pulmonary vascularity. No focal consolidation, pleural effusion, or pneumothorax. No acute osseous abnormality. IMPRESSION: 1. No acute cardiopulmonary disease. Electronically Signed   By: Obie Dredge M.D.   On: 03/08/2021 18:29    Procedures Procedures   Medications Ordered in ED Medications  sodium chloride 0.9 % bolus 1,000 mL (has no administration in time range)    ED Course  I have reviewed the triage vital signs and the nursing notes.  Pertinent labs & imaging results that were available during my care of the patient were reviewed by me and considered in my medical decision making (see chart for details).    MDM Rules/Calculators/A&P                         Samuel Austin is a 40 y.o. male here presenting with dizziness and chest pain.  Likely heat exhaustion versus mild dehydration.  Low risk for ACS troponin x2 sufficient.  Plan to get CBC and BMP and trop x 2. Will hydrate and reassess.   10:26 PM Troponin negative x2.  Patient not orthostatic.  Ordered IV fluids initially but he did not want to stay for.  Patient's been tolerating p.o. at home.  Told him to stay hydrated   Final Clinical Impression(s) / ED Diagnoses Final diagnoses:  Dehydration  Dizziness  Heat exhaustion, initial encounter  Chest pain, unspecified type    Rx / DC Orders ED Discharge Orders     None        Charlynne Pander, MD 03/08/21 2226

## 2021-03-08 NOTE — ED Provider Notes (Cosign Needed)
Emergency Medicine Provider Triage Evaluation Note  Samuel Austin , a 40 y.o. male  was evaluated in triage.  Pt complains of lightheadedness and shortness of breath.  Symptoms occurred earlier today after he completed walking a mile.  Patient reports that he had some discomfort to his left shoulder at the time as well as diaphoresis and nausea.  Patient reports that his lightheadedness and shortness of breath have improved however not gone away.    Review of Systems  Positive: Shortness of breath, nausea, lightheadedness Negative: Vomiting, syncope, chest pain, palpitations, leg swelling  Physical Exam  BP (!) 159/89 (BP Location: Right Arm)   Pulse 75   Temp 98.3 F (36.8 C) (Oral)   Resp 16   SpO2 97%  Gen:   Awake, no distress   Resp:  Normal effort, clear to auscultation bilaterally MSK:   Moves extremities without difficulty.  No swelling, tenderness, or or edema to bilateral lower extremities. Other:  S1, S2 present with no murmurs rubs or gallops.  Medical Decision Making  Medically screening exam initiated at 5:40 PM.  Appropriate orders placed.  Tunis Gentle was informed that the remainder of the evaluation will be completed by another provider, this initial triage assessment does not replace that evaluation, and the importance of remaining in the ED until their evaluation is complete.  Concerns for anginal equivalent, will obtain ACS work-up.  The patient appears stable so that the remainder of the work up may be completed by another provider.      Haskel Schroeder, [place] 03/08/21 1742

## 2021-03-08 NOTE — ED Notes (Signed)
Pt refuses to have fluids given states "I can hydrate my self at home" he decided to leave AMA. Pt oriented about risk for further complication pt still wanted to leave.

## 2021-03-08 NOTE — ED Triage Notes (Signed)
The pt became dizzy while walking and he aslo had some sob  sob still no distress

## 2021-03-08 NOTE — Discharge Instructions (Addendum)
Please stay hydrated  See your doctor   Return to ER if you have worse dizziness, chest pain, passing out

## 2021-03-21 ENCOUNTER — Telehealth (HOSPITAL_COMMUNITY): Payer: No Payment, Other | Admitting: Physician Assistant

## 2021-04-09 ENCOUNTER — Emergency Department (HOSPITAL_COMMUNITY)
Admission: EM | Admit: 2021-04-09 | Discharge: 2021-04-10 | Disposition: A | Discharge status: Home / Self Care | Payer: Self-pay | Attending: Emergency Medicine | Admitting: Emergency Medicine

## 2021-04-09 ENCOUNTER — Encounter (HOSPITAL_COMMUNITY): Payer: Self-pay | Admitting: Emergency Medicine

## 2021-04-09 ENCOUNTER — Other Ambulatory Visit: Payer: Self-pay

## 2021-04-09 DIAGNOSIS — R42 Dizziness and giddiness: Secondary | ICD-10-CM | POA: Insufficient documentation

## 2021-04-09 DIAGNOSIS — Z5321 Procedure and treatment not carried out due to patient leaving prior to being seen by health care provider: Secondary | ICD-10-CM | POA: Insufficient documentation

## 2021-04-09 DIAGNOSIS — R112 Nausea with vomiting, unspecified: Secondary | ICD-10-CM | POA: Insufficient documentation

## 2021-04-09 DIAGNOSIS — R197 Diarrhea, unspecified: Secondary | ICD-10-CM | POA: Insufficient documentation

## 2021-04-09 DIAGNOSIS — R109 Unspecified abdominal pain: Secondary | ICD-10-CM | POA: Insufficient documentation

## 2021-04-09 LAB — CBC WITH DIFFERENTIAL/PLATELET
Abs Immature Granulocytes: 0.01 10*3/uL (ref 0.00–0.07)
Basophils Absolute: 0 10*3/uL (ref 0.0–0.1)
Basophils Relative: 1 %
Eosinophils Absolute: 0.2 10*3/uL (ref 0.0–0.5)
Eosinophils Relative: 3 %
HCT: 49.2 % (ref 39.0–52.0)
Hemoglobin: 15.8 g/dL (ref 13.0–17.0)
Immature Granulocytes: 0 %
Lymphocytes Relative: 36 %
Lymphs Abs: 2 10*3/uL (ref 0.7–4.0)
MCH: 30.1 pg (ref 26.0–34.0)
MCHC: 32.1 g/dL (ref 30.0–36.0)
MCV: 93.7 fL (ref 80.0–100.0)
Monocytes Absolute: 0.7 10*3/uL (ref 0.1–1.0)
Monocytes Relative: 12 %
Neutro Abs: 2.7 10*3/uL (ref 1.7–7.7)
Neutrophils Relative %: 48 %
Platelets: 280 10*3/uL (ref 150–400)
RBC: 5.25 MIL/uL (ref 4.22–5.81)
RDW: 12.3 % (ref 11.5–15.5)
WBC: 5.6 10*3/uL (ref 4.0–10.5)
nRBC: 0 % (ref 0.0–0.2)

## 2021-04-09 LAB — COMPREHENSIVE METABOLIC PANEL
ALT: 35 U/L (ref 0–44)
AST: 33 U/L (ref 15–41)
Albumin: 4 g/dL (ref 3.5–5.0)
Alkaline Phosphatase: 63 U/L (ref 38–126)
Anion gap: 9 (ref 5–15)
BUN: 14 mg/dL (ref 6–20)
CO2: 23 mmol/L (ref 22–32)
Calcium: 9.1 mg/dL (ref 8.9–10.3)
Chloride: 104 mmol/L (ref 98–111)
Creatinine, Ser: 1.23 mg/dL (ref 0.61–1.24)
GFR, Estimated: >60 mL/min (ref >60)
Glucose, Bld: 134 mg/dL — ABNORMAL HIGH (ref 70–99)
Potassium: 3.8 mmol/L (ref 3.5–5.1)
Sodium: 136 mmol/L (ref 135–145)
Total Bilirubin: 0.7 mg/dL (ref 0.3–1.2)
Total Protein: 7 g/dL (ref 6.5–8.1)

## 2021-04-09 NOTE — ED Triage Notes (Signed)
Pt c/o abdominal pain, nausea/vomiting/diarrhea and dizziness after eating fish today.

## 2021-04-09 NOTE — ED Provider Notes (Signed)
Emergency Medicine Provider Triage Evaluation Note  Samuel Austin , a 40 y.o. male  was evaluated in triage.  Pt complains of abdominal pain after eating spinach and salmon   Review of Systems  Positive: Diarrhea and vomiting  Negative: fever  Physical Exam  BP (!) 153/95   Pulse 74   Temp 98.3 F (36.8 C) (Oral)   Resp 16   SpO2 98%  Gen:   Awake, no distress   Resp:  Normal effort  MSK:   Moves extremities without difficulty  Other:    Medical Decision Making  Medically screening exam initiated at 9:44 PM.  Appropriate orders placed.  Samuel Austin was informed that the remainder of the evaluation will be completed by another provider, this initial triage assessment does not replace that evaluation, and the importance of remaining in the ED until their evaluation is complete.     Elson Areas, Cordelia Poche 04/09/21 2145    Sloan Leiter, DO 04/10/21 0106

## 2021-04-10 NOTE — ED Notes (Signed)
Called x1 for vitals recheck with no response 

## 2021-04-10 NOTE — ED Notes (Signed)
x3 with no response

## 2021-04-10 NOTE — ED Notes (Signed)
x2 with no response 

## 2021-04-19 ENCOUNTER — Ambulatory Visit: Payer: Self-pay | Admitting: Cardiovascular Disease

## 2021-04-19 ENCOUNTER — Ambulatory Visit: Payer: Self-pay | Admitting: Internal Medicine

## 2021-04-25 ENCOUNTER — Telehealth: Payer: Self-pay | Admitting: Cardiovascular Disease

## 2021-04-25 ENCOUNTER — Other Ambulatory Visit: Payer: Self-pay | Admitting: Cardiovascular Disease

## 2021-04-25 NOTE — Telephone Encounter (Signed)
*  STAT* If patient is at the pharmacy, call can be transferred to refill team.   1. Which medications need to be refilled? (please list name of each medication and dose if known) lisinopril (ZESTRIL) 20 MG tablet   2. Which pharmacy/location (including street and city if local pharmacy) is medication to be sent to? [facility] - [place] ([place]), [place] - [address]  3. Do they need a 30 day or 90 day supply? 90 day   Patient is completely out of medication. He has an appointment 09/02/2021

## 2021-04-26 ENCOUNTER — Ambulatory Visit: Payer: Self-pay | Admitting: Internal Medicine

## 2021-04-26 MED ORDER — LISINOPRIL 20 MG PO TABS: 20.0000 mg | ORAL_TABLET | Freq: Every day | ORAL | 1 refills | Status: DC

## 2021-04-26 NOTE — Telephone Encounter (Signed)
Refills has been sent to the pharmacy. 

## 2021-05-10 ENCOUNTER — Encounter (HOSPITAL_COMMUNITY): Payer: Self-pay | Admitting: Student in an Organized Health Care Education/Training Program

## 2021-05-10 ENCOUNTER — Ambulatory Visit (INDEPENDENT_AMBULATORY_CARE_PROVIDER_SITE_OTHER): Payer: No Payment, Other | Admitting: Student in an Organized Health Care Education/Training Program

## 2021-05-10 ENCOUNTER — Other Ambulatory Visit: Payer: Self-pay

## 2021-05-10 DIAGNOSIS — F339 Major depressive disorder, recurrent, unspecified: Secondary | ICD-10-CM

## 2021-05-10 DIAGNOSIS — F203 Undifferentiated schizophrenia: Secondary | ICD-10-CM | POA: Diagnosis not present

## 2021-05-10 DIAGNOSIS — F411 Generalized anxiety disorder: Secondary | ICD-10-CM

## 2021-05-10 DIAGNOSIS — F32A Depression, unspecified: Secondary | ICD-10-CM | POA: Insufficient documentation

## 2021-05-10 MED ORDER — HYDROXYZINE HCL 25 MG PO TABS
25.0000 mg | ORAL_TABLET | Freq: Every day | ORAL | 0 refills | Status: DC
  Filled 2021-05-10 – 2021-05-31 (×2): qty 45, 45d supply, fill #0

## 2021-05-10 MED ORDER — RISPERIDONE 1 MG PO TABS
1.0000 mg | ORAL_TABLET | Freq: Every day | ORAL | 0 refills | Status: DC
Start: 2021-05-10 — End: 2021-06-06
  Filled 2021-05-10 – 2021-05-31 (×2): qty 30, 30d supply, fill #0

## 2021-05-10 MED ORDER — CITALOPRAM HYDROBROMIDE 10 MG PO TABS
10.0000 mg | ORAL_TABLET | Freq: Every day | ORAL | 0 refills | Status: DC
  Filled 2021-05-10 – 2021-05-31 (×2): qty 30, 30d supply, fill #0
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
## 2021-05-17 ENCOUNTER — Other Ambulatory Visit: Payer: Self-pay

## 2021-05-31 ENCOUNTER — Other Ambulatory Visit: Payer: Self-pay

## 2021-06-05 ENCOUNTER — Ambulatory Visit: Payer: Self-pay | Admitting: Internal Medicine

## 2021-06-06 ENCOUNTER — Encounter (HOSPITAL_COMMUNITY): Payer: Self-pay | Admitting: Student in an Organized Health Care Education/Training Program

## 2021-06-06 ENCOUNTER — Other Ambulatory Visit: Payer: Self-pay

## 2021-06-06 ENCOUNTER — Ambulatory Visit (INDEPENDENT_AMBULATORY_CARE_PROVIDER_SITE_OTHER): Payer: No Payment, Other | Admitting: Student in an Organized Health Care Education/Training Program

## 2021-06-06 DIAGNOSIS — F411 Generalized anxiety disorder: Secondary | ICD-10-CM | POA: Diagnosis not present

## 2021-06-06 DIAGNOSIS — F203 Undifferentiated schizophrenia: Secondary | ICD-10-CM | POA: Diagnosis not present

## 2021-06-06 DIAGNOSIS — F339 Major depressive disorder, recurrent, unspecified: Secondary | ICD-10-CM

## 2021-06-06 MED ORDER — RISPERIDONE 1 MG PO TABS
1.0000 mg | ORAL_TABLET | Freq: Every day | ORAL | 0 refills | Status: AC
  Filled 2021-06-06: qty 30, 30d supply, fill #0

## 2021-06-06 MED ORDER — CITALOPRAM HYDROBROMIDE 10 MG PO TABS
15.0000 mg | ORAL_TABLET | Freq: Every day | ORAL | 0 refills | Status: AC
  Filled 2021-06-06: qty 45, 30d supply, fill #0

## 2021-06-06 MED ORDER — HYDROXYZINE HCL 25 MG PO TABS
25.0000 mg | ORAL_TABLET | Freq: Every day | ORAL | 0 refills | Status: AC
  Filled 2021-06-06: qty 45, 45d supply, fill #0

## 2021-06-06 NOTE — Progress Notes (Signed)
BH MD/PA/NP OP Progress Note  06/06/2021 3:45 PM Samuel Austin  MRN:  [id number]  Chief Complaint:  Chief Complaint   Medication Management    HPI:   [name] is a 40 yr old male who presents for follow up. His PPHx is significant for depression, anxiety, bipolar disorder, PTSD, schizophrenia.  He reports that since her initial visit he has been doing okay.  He reports no longer having any hallucinations.  However, he reports that he is still having significant issues with anxiety.  He specifically is concerned about a heart attack because he has had high blood pressure and also dyslipidemia.  He reports that he checks his blood pressure often at home but sometimes forgets to take his rosuvastatin.  Encouraged him to continue taking his medications and follow-up with his PCP for further advice on what he can do to reduce his risks.  He reports that his sleep is so-so.  He reports that some nights he sleeps very well through the whole night but then sometimes when his anxiety is really active he will have trouble sleeping.  Discussed increasing his Celexa to better manage his anxiety he is agreeable to this.  He does report that taking Atarax is helpful in aborting some of his panic attacks so we will continue with this as well.  He reports today no SI, HI, or AVH.  He does report minor headache but otherwise has no complaints.  He has no other concerns at present.  Discussed following back up in 1 month of which she was agreeable to.  He states that he has not had a therapy appointment and encouraged him to again follow-up on discharge with scheduling a therapy appointment to better handle the trauma surrounding his brother's murder.  Visit Diagnosis:    ICD-10-CM   1. Recurrent major depressive disorder, remission status unspecified (HCC)  F33.9 citalopram (CELEXA) 10 MG tablet    2. Generalized anxiety disorder  F41.1 hydrOXYzine (ATARAX/VISTARIL) 25 MG tablet    3.  Undifferentiated schizophrenia (HCC)  F20.3 risperiDONE (RISPERDAL) 1 MG tablet      Past Psychiatric History: Depression, Anxiety, PTSD, and Bipolar Disorder  Past Medical History:  Past Medical History:  Diagnosis Date   Anal fistula    Anxiety    Bilateral inguinal hernia 07/2018   Bipolar disorder (HCC)    Depression    Heart palpitations    Hyperchloremia    Hyperlipidemia    Hypertension    Obesity    Panic disorder    PTSD (post-traumatic stress disorder)    Pulsatile tinnitus    Sleep apnea    uses CPAP nightly   Tic disorder    Tourette disorder    Vertigo    History reviewed. No pertinent surgical history.  Family Psychiatric History: Samuel Austin- Schizophrenia  Family History:  Family History  Problem Relation Age of Onset   Drug abuse Samuel Austin        crack   Bipolar disorder Samuel Austin    Heart disease Samuel Austin        Carotid artery disease   Alcohol abuse Father     Social History:  Social History   Socioeconomic History   Marital status: Single    Spouse name: Not on file   Number of children: Not on file   Years of education: Not on file   Highest education level: Not on file  Occupational History   Not on file  Tobacco Use   Smoking status:  Former    Types: Cigars    Quit date: 11/2018    Years since quitting: 2.5   Smokeless tobacco: Current  Vaping Use   Vaping Use: Never used  Substance and Sexual Activity   Alcohol use: Yes    Comment: occasional   Drug use: Not Currently    Types: Marijuana    Comment: Last use 07/2018   Sexual activity: Not on file  Other Topics Concern   Not on file  Social History Narrative   Lives with a male friend   Samuel Austin moved to [place] from [place] in recent years.    No children.  Thought he had a daughter, but DNA showed he was not her father.   Social Determinants of Health   Financial Resource Strain: Not on file  Food Insecurity: Not on file  Transportation Needs: Not on file  Physical  Activity: Not on file  Stress: Not on file  Social Connections: Not on file    Allergies:  Allergies  Allergen Reactions   Motrin [Ibuprofen] Hives   Lactose Intolerance (Gi) Diarrhea    Metabolic Disorder Labs: No results found for: HGBA1C, MPG No results found for: PROLACTIN Lab Results  Component Value Date   CHOL 154 03/07/2019   TRIG 156 (H) 03/07/2019   HDL 42 03/07/2019   LDLCALC 81 03/07/2019   LDLCALC 144 (H) 01/04/2019   No results found for: TSH  Therapeutic Level Labs: No results found for: LITHIUM No results found for: VALPROATE No components found for:  CBMZ  Current Medications: Current Outpatient Medications  Medication Sig Dispense Refill   lisinopril (ZESTRIL) 20 MG tablet Take 1 tablet (20 mg total) by mouth daily. KEEP OV. 90 tablet 1   simvastatin (ZOCOR) 40 MG tablet Take 1 tablet (40 mg total) by mouth daily at 6 PM. 30 tablet 3   citalopram (CELEXA) 10 MG tablet Take 1.5 tablets (15 mg total) by mouth daily. 45 tablet 0   hydrOXYzine (ATARAX/VISTARIL) 25 MG tablet Take 1 tablet (25 mg total) by mouth daily for 45 doses. 45 tablet 0   risperiDONE (RISPERDAL) 1 MG tablet Take 1 tablet (1 mg total) by mouth at bedtime. 30 tablet 0   No current facility-administered medications for this visit.     Musculoskeletal: Strength & Muscle Tone: within normal limits Gait & Station: normal Patient leans: N/A  Psychiatric Specialty Exam: Review of Systems  Respiratory:  Negative for cough and shortness of breath.   Cardiovascular:  Negative for chest pain.  Gastrointestinal:  Negative for abdominal pain, constipation, diarrhea, nausea and vomiting.  Neurological:  Positive for headaches. Negative for weakness.  Psychiatric/Behavioral:  Negative for hallucinations and suicidal ideas. The patient is nervous/anxious.    Blood pressure (!) 141/84, pulse 80, height 5\' 4"  (1.626 m), weight 288 lb (130.6 kg).Body mass index is 49.44 kg/m.  General  Appearance: Casual and Fairly Groomed  Eye Contact:  Good  Speech:  Clear and Coherent and Normal Rate  Volume:  Normal  Mood:  Anxious  Affect:  Appropriate and Congruent  Thought Process:  Coherent  Orientation:  Full (Time, Place, and Person)  Thought Content: WDL and Logical   Suicidal Thoughts:  No  Homicidal Thoughts:  No  Memory:  Immediate;   Fair Recent;   Fair  Judgement:  Fair  Insight:  Fair  Psychomotor Activity:   Has periodic grimacing as part of his Tic Disorder, otherwise normal  Concentration:  Concentration: Good and Attention Span: Good  Recall:  Good  Fund of Knowledge: Good  Language: Good  Akathisia:  Negative  Handed:  Right  AIMS (if indicated): done score of zero no rigidity or cogwheeling  Assets:  Communication Skills Desire for Improvement Resilience Social Support  ADL's:  Intact  Cognition: WNL  Sleep:  Fair   Screenings: Flowsheet Row ED from 04/09/2021 in [facility] EMERGENCY DEPARTMENT ED from 03/08/2021 in [facility] EMERGENCY DEPART[facility] ED from 10/08/2020 in [facility] EMERGENCY DEPARTMENT  C-SSRS RISK CATEGORY No Risk Error: Question 2 not populated No Risk        Assessment and Plan:  He appears to be doing somewhat better.  He reports no hallucinations.  He does report still having significant issues with his anxiety.  We will increase his Celexa and continue his Atarax.  We will see him back in about 1 month and at that time may make further increases to his Celexa.   Depression  PTSD: -Increase Celexa to 15 mg daily     Schizophrenia: -Continue Risperdal 1 mg QHS     Anxiety: -Continue Atarax 25 mg 1-2 tablets daily  Lauro Franklin, MD 06/06/2021, 3:45 PM

## 2021-06-12 ENCOUNTER — Encounter (HOSPITAL_COMMUNITY): Payer: No Payment, Other | Admitting: Student in an Organized Health Care Education/Training Program

## 2021-07-08 ENCOUNTER — Encounter: Payer: Self-pay | Admitting: Internal Medicine

## 2021-07-08 ENCOUNTER — Ambulatory Visit: Payer: Self-pay | Admitting: Internal Medicine

## 2021-07-08 ENCOUNTER — Other Ambulatory Visit: Payer: Self-pay

## 2021-07-08 VITALS — BP 146/86 | HR 68 | Resp 24 | Ht 64.0 in | Wt 271.0 lb

## 2021-07-08 DIAGNOSIS — I10 Essential (primary) hypertension: Secondary | ICD-10-CM

## 2021-07-08 DIAGNOSIS — R739 Hyperglycemia, unspecified: Secondary | ICD-10-CM

## 2021-07-08 DIAGNOSIS — E785 Hyperlipidemia, unspecified: Secondary | ICD-10-CM

## 2021-07-08 MED ORDER — LISINOPRIL 20 MG PO TABS: 20.0000 mg | ORAL_TABLET | Freq: Every day | ORAL | 3 refills | Status: DC

## 2021-07-08 MED ORDER — SIMVASTATIN 40 MG PO TABS: 40.0000 mg | ORAL_TABLET | Freq: Every day | ORAL | 3 refills | Status: DC

## 2021-07-08 NOTE — Progress Notes (Signed)
    Subjective:    Patient ID: Samuel Austin, male   DOB: February 17, 1981, 40 y.o.   MRN: [id number]   HPI  Has not been seen for more than 1 year.    Had a son born prematurely on 10/13/20:  had congenital cataracts and glaucoma and surgeries, so has been busy.     Tobacco and alcohol abuse:  started back smoking and drinking after last seen here in 03/2020 due to life stressors.  Stopped smoking in March of 2022.  Now just drinking once weekly.  3 shots and one beer when he does drink -watching football.  2.  Hypertension:  stopped taking all meds for about 2 months as he "wanted to be more natural"  Boiling lemon juice and honey and drinking that as well as exercising more.  Backed off fried foods.  Not clear why he started back up--sounds like he just was worried he had developing problems.   Held the Lisinopril yesterday as he was planning to drink.  Also admits to taking his medication at significantly different intervals.  3.  HM:  has had 2 COVID vaccines.  Everyone in family had COVID about 3 months ago--even infant son.  Has not had influenza vaccine.  Current Meds  Medication Sig   citalopram (CELEXA) 10 MG tablet Take 1.5 tablets (15 mg total) by mouth daily.   hydrOXYzine (ATARAX/VISTARIL) 25 MG tablet Take 1 tablet (25 mg total) by mouth daily for 45 doses.   lisinopril (ZESTRIL) 20 MG tablet Take 1 tablet (20 mg total) by mouth daily. KEEP OV.   risperiDONE (RISPERDAL) 1 MG tablet Take 1 tablet (1 mg total) by mouth at bedtime.   simvastatin (ZOCOR) 40 MG tablet Take 1 tablet (40 mg total) by mouth daily at 6 PM.   Allergies  Allergen Reactions   Motrin [Ibuprofen] Hives   Lactose Intolerance (Gi) Diarrhea     Review of Systems    Objective:   BP (!) 146/86 (BP Location: Left Arm, Patient Position: Sitting, Cuff Size: Normal)   Pulse 68   Resp (!) 24   Ht 5\' 4"  (1.626 m)   Wt 271 lb (122.9 kg)   BMI 46.52 kg/m   Physical Exam NAD Obese HEENT:  PERRL  EOMI Neck:  Supple, No adenopathy Chest:  CTA CV:  RRR with normal S1 and S2, No S3, S4 or murmur  No carotid bruits.  Carotid, radial and DP pulses normal and equal Abd:  S NT, No HSM or mass, + BS LE:  No edema.   Neuro:  Usual tics   Assessment & Plan    Tobacco Abuse:  No longer smoking.  Congratulated him  2.  Alcohol Abuse:  encouraged no drinking as he has difficulties at times regulating.  Ready to offer support if he decides he needs.    3.  Hypertension:  once again, encouraged consistency with meds to prevent vascular damage and complications.  Refilled Lisinopril 20 mg daily.  Also to work on diet and physical activity.  Follow up in 6 weeks.  Kidney function and electrolytes fine in August.  4.  Hyperlipidemia:  statin refilled.  Return after FLP in 6 weeks.  5.  Hyperglycemia with labs in August:  A1C.  6.  HM:  encouraged COVID vaccination for patient and family

## 2021-07-08 NOTE — Patient Instructions (Signed)
Drink a glass of water before every meal Drink 6-8 glasses of water daily Eat three meals daily Eat a protein and healthy fat with every meal (eggs,fish, chicken, [place] and limit red meats) Eat 5 servings of vegetables daily, mix the colors Eat 2 servings of fruit daily with skin, if skin is edible Use smaller plates Put food/utensils down as you chew and swallow each bite Eat at a table with friends/family at least once daily, no TV Do not eat in front of the TV  Recent studies show that people who consume all of their calories in a 12 hour period lose weight more efficiently.  For example, if you eat your first meal at 7:00 a.m., your last meal of the day should be completed by 7:00 p.m.   Walk daily--add 5 minutes weekly. Write down weekly goals.

## 2021-07-09 LAB — HGB A1C W/O EAG: Hgb A1c MFr Bld: 4.9 % (ref 4.8–5.6)

## 2021-07-11 ENCOUNTER — Telehealth (HOSPITAL_COMMUNITY): Payer: Self-pay | Admitting: Licensed Clinical Social Worker

## 2021-07-11 ENCOUNTER — Encounter (HOSPITAL_COMMUNITY): Payer: No Payment, Other | Admitting: Student in an Organized Health Care Education/Training Program

## 2021-07-11 ENCOUNTER — Ambulatory Visit (HOSPITAL_COMMUNITY): Payer: No Payment, Other | Admitting: Licensed Clinical Social Worker

## 2021-07-11 NOTE — Telephone Encounter (Signed)
LCSW spoke with pt via virtual link for his 1300 appt. Pt reported that psychiatrist referred him to counseling after LCSW asked "What brings you in today". Pt then stated he does not like to talk about his problems. LCSW explained therapy will be conducted at least 1 x monthly for 40 to 60 minutes. Pt stated, "I just want to stick with the doctor right now". LCSW offered to complete the assessment and pt politely declined. LCSW did attempted to explain the benefits for psychotropic medications along with therapy has been proven for the best improvement in mental health. Pt stated "I will keep that in mind"

## 2021-08-20 ENCOUNTER — Other Ambulatory Visit: Payer: Self-pay

## 2021-08-20 DIAGNOSIS — E785 Hyperlipidemia, unspecified: Secondary | ICD-10-CM

## 2021-08-21 LAB — LIPID PANEL W/O CHOL/HDL RATIO
Cholesterol, Total: 145 mg/dL (ref 100–199)
HDL: 35 mg/dL — ABNORMAL LOW (ref >39)
LDL Chol Calc (NIH): 76 mg/dL (ref 0–99)
Triglycerides: 204 mg/dL — ABNORMAL HIGH (ref 0–149)
VLDL Cholesterol Cal: 34 mg/dL (ref 5–40)

## 2021-08-27 ENCOUNTER — Encounter: Payer: Self-pay | Admitting: Internal Medicine

## 2021-08-27 ENCOUNTER — Ambulatory Visit: Payer: Self-pay | Admitting: Internal Medicine

## 2021-08-27 VITALS — BP 144/76 | HR 64 | Resp 20 | Ht 64.0 in | Wt 274.0 lb

## 2021-08-27 DIAGNOSIS — E785 Hyperlipidemia, unspecified: Secondary | ICD-10-CM

## 2021-08-27 DIAGNOSIS — I10 Essential (primary) hypertension: Secondary | ICD-10-CM

## 2021-08-27 MED ORDER — METOPROLOL SUCCINATE ER 25 MG PO TB24: 25.0000 mg | ORAL_TABLET | Freq: Every day | ORAL | 11 refills | Status: DC

## 2021-08-27 NOTE — Patient Instructions (Addendum)
Write out your weekly goals every week for the next month for both eating habits and physical activity--first goal is to buy a calendar to write weekly goals!!!!!!!!!  Ask [facility] about Meditation classes.  Salmon, mackerel, olives, nuts, avocados and regular physical activity

## 2021-08-27 NOTE — Progress Notes (Signed)
    Subjective:    Patient ID: Samuel Austin, male   DOB: Apr 22, 1981, 41 y.o.   MRN: [id number]   HPI  Obesity/hypertension:  Eating fresh veggies and cut back on snacks.  Not eating in front of TV and eating more slowly.  Was losing weight until the last 2 weeks with the holidays and then gained about 4 lbs back.  Gets a headache when taking Lisinopril.  Wondering about another medication  2.  Panic/Anxiety/depression:  still dealing with anxiety and panic disorder.  Really struggling with panic attacks.  Having one every day.  Not going to [facility].     Current Meds  Medication Sig   citalopram (CELEXA) 10 MG tablet Take 1.5 tablets (15 mg total) by mouth daily.   hydrOXYzine (ATARAX) 25 MG tablet Take 25 mg by mouth as needed.   lisinopril (ZESTRIL) 20 MG tablet Take 1 tablet (20 mg total) by mouth daily. KEEP OV.   risperiDONE (RISPERDAL) 1 MG tablet Take 1 tablet (1 mg total) by mouth at bedtime.   simvastatin (ZOCOR) 40 MG tablet Take 1 tablet (40 mg total) by mouth daily at 6 PM.   Allergies  Allergen Reactions   Motrin [Ibuprofen] Hives   Lactose Intolerance (Gi) Diarrhea     Review of Systems    Objective:   BP (!) 144/76 (BP Location: Left Arm, Patient Position: Sitting, Cuff Size: Normal)   Pulse 64   Resp 20   Ht 5\' 4"  (1.626 m)   Wt 274 lb (124.3 kg)   BMI 47.03 kg/m   Physical Exam NAD HEENT:  PERRL, EOMI, discs sharp, TMs pearly gray, throat without injection Neck:  Supple, No adenopathy Chest:  CTA CV:  RRR with normal S1 and S2, No S3, S4 or murmur.  Radial and DP pulses normal and equal LE:  no edema   Assessment & Plan   Hypertension/obesity/Hyperlipidemia:  encouraged making goals for eating habit changes and physical activity.  Add Toprol XL 25 mg daily to BP regimen.  Consider wean off Lisinopril if significant drop with beta blocker.    2.  Mental Health issues:  encouraged him to get back to [facility] for treatment, particularly with increasing panic attacks.

## 2021-08-28 ENCOUNTER — Telehealth: Payer: Self-pay

## 2021-08-28 NOTE — Telephone Encounter (Signed)
Pt wants to let Dr Delrae Alfred know that he has decided to stick with his lisinopril only until next appointment

## 2021-08-29 NOTE — Telephone Encounter (Signed)
Please let him know he needs the other medication as well as his bp was not controlled when here and taking Lisinopril.  This new med (Metoprolol) is to get bp down and then to see later if can come off Lisinopril.  So he needs the second med no matter what.

## 2021-08-30 NOTE — Telephone Encounter (Signed)
Pt notified that he should be taking metoprolol. He plans to pick it up today to start

## 2021-09-02 ENCOUNTER — Other Ambulatory Visit: Payer: Self-pay

## 2021-09-02 ENCOUNTER — Encounter: Payer: Self-pay | Admitting: Cardiovascular Disease

## 2021-09-02 ENCOUNTER — Ambulatory Visit (INDEPENDENT_AMBULATORY_CARE_PROVIDER_SITE_OTHER): Payer: Self-pay | Admitting: Cardiovascular Disease

## 2021-09-02 VITALS — BP 124/70 | HR 63 | Ht 64.0 in | Wt 277.0 lb

## 2021-09-02 DIAGNOSIS — I10 Essential (primary) hypertension: Secondary | ICD-10-CM

## 2021-09-02 DIAGNOSIS — E782 Mixed hyperlipidemia: Secondary | ICD-10-CM

## 2021-09-02 DIAGNOSIS — G4733 Obstructive sleep apnea (adult) (pediatric): Secondary | ICD-10-CM

## 2021-09-02 NOTE — Patient Instructions (Addendum)
Medication Instructions:  TAKE NATURES BOUNTY FISH OIL 1,400MG  WITH DHA  *If you need a refill on your cardiac medications before your next appointment, please call your pharmacy*  Lab Work:   Testing/Procedures:  NONE    NONE  Special Instructions RESMED F30i CPAP MASK  Follow-Up: Your next appointment:  12 month(s) In Person with Shelva Majestic, MD   Please call our office 2 months in advance to schedule this appointment   :1  At [facility], you and your health needs are our priority.  As part of our continuing mission to provide you with exceptional heart care, we have created designated Provider Care Teams.  These Care Teams include your primary Cardiologist (physician) and Advanced Practice Providers (APPs -  Physician Assistants and Nurse Practitioners) who all work together to provide you with the care you need, when you need it.  We recommend signing up for the patient portal called "MyChart".  Sign up information is provided on this After Visit Summary.  MyChart is used to connect with patients for Virtual Visits (Telemedicine).  Patients are able to view lab/test results, encounter notes, upcoming appointments, etc.  Non-urgent messages can be sent to your provider as well.   To learn more about what you can do with MyChart, go to [email or web address].

## 2021-09-02 NOTE — Progress Notes (Incomplete)
Cardiology Office Note    Date:  09/02/2021   ID:  Samuel Austin, DOB September 29, 1980, MRN [id number]  PCP:  Mack Hook, MD  Cardiologist:  Shelva Majestic, MD   60-monthfollow-up sleep evaluation  History of Present Illness:  Samuel Austin a 41y.o. male who presents for a 35-monthollow-up sleep evaluation   Samuel Austin a history of anxiety and depression, hypertension, hyperlipidemia, as well as palpitations.  There also was a family history for sleep apnea.  The patient is followed by Dr. [name] primary care and has seen Dr. AcMargaretann Lovelessith atypical chest pain.  The patient has been on lisinopril HCT for hypertension, hydroxyzine for anxiety in addition to Risperdal and Celexa.   Due to concerns for obstructive sleep apnea with significant loud snoring, he was referred for a sleep study which was done on August 27, 2018.  This revealed severe sleep apnea with an AHI of 37.4, RDI of 79.4, and AHI with supine sleep at 72.9.  The patient was unable to achieve REM sleep in the diagnostic portion of the study.  CPAP was instituted and he was titrated up to 13 cm where AHI was 4.1.  CPAP auto therapy was recommended and he received a ResMed air sense 10 CPAP auto unit with pressure settings with a minimum of 13 up to a maximum of 20.   I saw him for initial sleep evaluation in a telemedicine visit on December 07, 2018.  Since initiating CPAP therapy, he has noticed remarkable benefit.  He now has significantly more energy.  Previously he was having 3-4 episodes of nocturia per night and this is 0-1 time per night.  Previously he would wake up gasping for breath.  His frequent awakenings have resolved and he is now sleeping most of the night.  He typically sleeps 8 hours per night.  A download was obtained since CPAP initiation from March 15 through December 06, 2018.  He is 100% compliant.  He is averaging 8 hours and 55 minutes of sleep per night.  AHI is excellent at 0.9  with his 95th percentile pressure at 14.8 and maximum pressure at 16.  He has noticed disappearance of prior snoring.  He now feels like he is getting into deeper sleep.  He is using a full facemask.  He states he has been getting supplies on [email or web address]  Upon further questions, he was concerned about his lipid status.  He states his primary doctor had taken him off his lovastatin therapy.  He is concerned since laboratory in August 2019 was still elevated.  Total cholesterol was 222, triglycerides 265, HDL 40, VLDL 53, LDL cholesterol 129.  He was advised to improve diet exercise.     He was using CPAP with 100% compliance but his machine malfunction.  As result from mid March he was unable to use CPAP therapy at all until he received a new machine approximately 1 month ago.  Prior to his machine breaking a download from September 21, 2020 had revealed 100% usage until March 17.  He had a CPAP minimum pressure of 13 with maximum pressure of 20 and his 95th percentile pressure was 16.2 with a maximum average pressure of 17.2.    When I last saw him in May 2021 he had received a new machine which has not been linked to our office so I was unable to obtain the most recent download from the new machine.  Presently he goes to  bed between 11 PM and midnight.  He is unaware of breakthrough snoring.  Epworth Sleepiness Scale score was calculated in the office today and this endorsed at 15 consistent with residual daytime sleepiness.  Over the last several months he admits to 100% compliance with CPAP therapy and that he "cannot sleep without it.."   Prior to CPAP he was awakening gasping for breath.  Since his last evaluation he had given [place] the serial number of his device but we residual unable to link his machine to our office.  The device number was necessary and this was obtained today with the device number of 606.  As result in the office today we were able to then successfully leak his CPAP therapy to our  office for my review.  A download from July 27 through April 18, 2020 shows 100% compliance with average usage of 8 hours and 21 minutes.  He is set at a minimum pressure of 13 with a maximum pressure of 20.  AHI is excellent at 0.7.  95th percentile pressure is 19.7 with a maximum average pressure of 16.9.  He does have moderate mask leak on most nights.  He has been using a full facemask.  He has not been very successful with weight loss.  He presents for evaluation.  Past Medical History:  Diagnosis Date   Anal fistula    Anxiety    Bilateral inguinal hernia 07/2018   Bipolar disorder ([place])    Depression    Heart palpitations    Hyperchloremia    Hyperlipidemia    Hypertension    Obesity    Panic disorder    PTSD (post-traumatic stress disorder)    Pulsatile tinnitus    Sleep apnea    uses CPAP nightly   Tic disorder    Tourette disorder    Vertigo     No past surgical history on file.  Current Medications: Outpatient Medications Prior to Visit  Medication Sig Dispense Refill   citalopram (CELEXA) 10 MG tablet Take 1.5 tablets (15 mg total) by mouth daily. 45 tablet 0   hydrOXYzine (ATARAX) 25 MG tablet Take 25 mg by mouth as needed.     lisinopril (ZESTRIL) 20 MG tablet Take 1 tablet (20 mg total) by mouth daily. KEEP OV. 90 tablet 3   metoprolol succinate (TOPROL-XL) 25 MG 24 hr tablet Take 1 tablet (25 mg total) by mouth daily. 30 tablet 11   risperiDONE (RISPERDAL) 1 MG tablet Take 1 tablet (1 mg total) by mouth at bedtime. 30 tablet 0   simvastatin (ZOCOR) 40 MG tablet Take 1 tablet (40 mg total) by mouth daily at 6 PM. 90 tablet 3   No facility-administered medications prior to visit.     Allergies:   Motrin [ibuprofen] and Lactose intolerance (gi)   Social History   Socioeconomic History   Marital status: Single    Spouse name: Not on file   Number of children: Not on file   Years of education: Not on file   Highest education level: Not on file   Occupational History   Not on file  Tobacco Use   Smoking status: Former    Types: Cigars    Quit date: 11/2018    Years since quitting: 2.7   Smokeless tobacco: Current  Vaping Use   Vaping Use: Never used  Substance and Sexual Activity   Alcohol use: Yes    Comment: occasional   Drug use: Not Currently    Types: Marijuana  Comment: Last use 07/2018   Sexual activity: Not on file  Other Topics Concern   Not on file  Social History Narrative   Lives with a male friend   Mother moved to [place] from [place] in recent years.    No children.  Thought he had a daughter, but DNA showed he was not her father.   Social Determinants of Health   Financial Resource Strain: Not on file  Food Insecurity: Not on file  Transportation Needs: Not on file  Physical Activity: Not on file  Stress: Not on file  Social Connections: Not on file     Family History:  The patient's family history includes Alcohol abuse in his father; Bipolar disorder in his mother; Drug abuse in his mother; Heart disease in his mother.   ROS General: Negative; No fevers, chills, or night sweats;  HEENT: Negative; No changes in vision or hearing, sinus congestion, difficulty swallowing Pulmonary: Negative; No cough, wheezing, shortness of breath, hemoptysis Cardiovascular: Negative; No chest pain, presyncope, syncope, palpitations GI: Negative; No nausea, vomiting, diarrhea, or abdominal pain GU: Negative; No dysuria, hematuria, or difficulty voiding Musculoskeletal: Negative; no myalgias, joint pain, or weakness Hematologic/Oncology: Negative; no easy bruising, bleeding Endocrine: Negative; no heat/cold intolerance; no diabetes Neuro: Negative; no changes in balance, headaches Skin: Negative; No rashes or skin lesions Psychiatric: Negative; No behavioral problems, depression Sleep: Negative; No snoring, daytime sleepiness, hypersomnolence, bruxism, restless legs, hypnogognic hallucinations, no  cataplexy Other comprehensive 14 point system review is negative.   PHYSICAL EXAM:   VS:  BP 124/70    Pulse 63    Ht _0  (1.626 m)    Wt 277 lb (125.6 kg)    SpO2 97%    BMI 47.55 kg/m     Repeat blood pressure by me was 128/80  Wt Readings from Last 3 Encounters:  09/02/21 277 lb (125.6 kg)  08/27/21 274 lb (124.3 kg)  07/08/21 271 lb (122.9 kg)    General: Alert, oriented, no distress.  Skin: normal turgor, no rashes, warm and dry HEENT: Normocephalic, atraumatic. Pupils equal round and reactive to light; sclera anicteric; extraocular muscles intact;  Nose without nasal septal hypertrophy Mouth/Parynx benign; Mallinpatti scale 4 Neck: No JVD, no carotid bruits; normal carotid upstroke Lungs: clear to ausculatation and percussion; no wheezing or rales Chest wall: without tenderness to palpitation Heart: PMI not displaced, RRR, s1 s2 normal, 1/6 systolic murmur, no diastolic murmur, no rubs, gallops, thrills, or heaves Abdomen: Central adiposity soft, nontender; no hepatosplenomehaly, BS+; abdominal aorta nontender and not dilated by palpation. Back: no CVA tenderness Pulses 2+ Musculoskeletal: full range of motion, normal strength, no joint deformities Extremities: no clubbing cyanosis or edema, Homan's sign negative  Neurologic: grossly nonfocal; Cranial nerves grossly wnl Psychologic: Normal mood and affect   Studies/Labs Reviewed:      I personally reviewed his most recent ECG from April 04, 2019 which showed normal sinus rhythm at 96 bpm without ectopy.  Recent Labs: BMP Latest Ref Rng & Units 04/09/2021 03/08/2021 02/09/2020  Glucose 70 - 99 mg/dL 134(H) 84 107(H)  BUN 6 - 20 mg/dL _1 Creatinine 0.61 - 1.24 mg/dL 1.23 1.24 1.20  Sodium 135 - 145 mmol/L 136 136 142  Potassium 3.5 - 5.1 mmol/L 3.8 3.9 4.0  Chloride 98 - 111 mmol/L 104 103 103  CO2 22 - 32 mmol/L 23 26 -  Calcium 8.9 - 10.3 mg/dL 9.1 9.5 -     Hepatic Function Latest Ref  Rng & Units  04/09/2021 02/09/2020 06/07/2019  Total Protein 6.5 - 8.1 g/dL 7.0 6.9 7.2  Albumin 3.5 - 5.0 g/dL 4.0 4.1 4.1  AST 15 - 41 U/L 33 35 39  ALT 0 - 44 U/L 35 28 35  Alk Phosphatase 38 - 126 U/L 63 47 49  Total Bilirubin 0.3 - 1.2 mg/dL 0.7 1.2 1.2  Bilirubin, Direct 0.00 - 0.40 mg/dL - - -    CBC Latest Ref Rng & Units 04/09/2021 03/08/2021 02/09/2020  WBC 4.0 - 10.5 K/uL 5.6 4.6 -  Hemoglobin 13.0 - 17.0 g/dL 15.8 16.8 17.3(H)  Hematocrit 39.0 - 52.0 % 49.2 51.1 51.0  Platelets 150 - 400 K/uL 280 277 -   Lab Results  Component Value Date   MCV 93.7 04/09/2021   MCV 93.4 03/08/2021   MCV 94.8 02/09/2020   No results found for: TSH Lab Results  Component Value Date   HGBA1C 4.9 07/08/2021     BNP No results found for: BNP  ProBNP No results found for: PROBNP   Lipid Panel     Component Value Date/Time   CHOL 145 08/20/2021 0933   TRIG 204 (H) 08/20/2021 0933   HDL 35 (L) 08/20/2021 0933   LDLCALC 76 08/20/2021 0933   LABVLDL 34 08/20/2021 0933     RADIOLOGY: No results found.   Additional studies/ records that were reviewed today include:  I reviewed the patient's sleep study.  I reviewed and obtained his download from January 28 through December 20, 2019.  He was 100% compliant until his machine broke in mid March.  AHI 0.6 at a 95 percentile pressure of 16.2 with maximum average pressure 17.2 centimeters of water  His machine was ultimately linked to our office today and a download was achieved from July 27 through April 18, 2020 which was reviewed with the patient.  ASSESSMENT:    No diagnosis found.   PLAN:  Samuel Austin is a 41 year old African-American gentleman who has a history of morbid obesity and was found to have severe obstructive sleep apnea on his diagnostic polysomnogram in January 2020 with an AHI of 37.4/h, RDI of 79.4/h and AHI with supine sleep at 72.9/h.  He was unable to achieve any REM sleep on the diagnostic portion of his study.  Since  that time he has been on CPAP therapy and has continued to have excellent compliance.  Apparently, his machine has malfunctioned in mid March 2021 and ultimately had to receive a new machine.  Since reinitiating therapy he admits to 100% compliance.  He "cannot sleep without it."  Previously he had significant episodes of gasping for breath, nonrestorative sleep, frequent awakenings, daytime sleepiness, loud snoring and morning headaches. Finally able to link his device to her office and his download confirms 100% compliance with excellent benefit with an AHI of 0.7 at his current pressure settings.  His 95th percentile pressure is 15.7cm of water.  His blood pressure today is improved and he continues to be on hydrochlorothiazide 12.5 mg as needed for swelling, lisinopril 20 mg daily.  He is on simvastatin 40 mg for hyperlipidemia.  He continues to be morbidly obese.  We discussed the importance of weight loss.  We discussed the importance of exercising at least 5 days/week with moderate intensity for at least 30 minutes if at all possible.  I have recommended follow-up lipid panel be obtained to reassess lipid studies which were last checked in July 2020.  Recent laboratory in June revealed  a creatinine of 1.2 and BUN of 16 current therapy.  I will see him in 6 months for reevaluation.  Medication Adjustments/Labs and Tests Ordered: Current medicines are reviewed at length with the patient today.  Concerns regarding medicines are outlined above.  Medication changes, Labs and Tests ordered today are listed in the Patient Instructions below. There are no Patient Instructions on file for this visit.   Signed, Shelva Majestic, MD  09/02/2021 4:11 PM    [facility] [address], [place], [place], [place]  [address] Phone: [phone]

## 2021-09-02 NOTE — Progress Notes (Signed)
Cardiology Office Note    Date:  09/06/2021   ID:  Samuel Austin, DOB [birth year], MRN [id number]  PCP:  Mack Hook, MD  Cardiologist:  Shelva Majestic, MD   60-monthfollow-up sleep evaluation  History of Present Illness:  Samuel Skillernis a 41y.o. male who presents for a follow-up sleep evaluation   Samuel Austin a history of anxiety and depression, hypertension, hyperlipidemia, as well as palpitations.  There also was a family history for sleep apnea.  The patient is followed by Dr. MAmil Amenfor primary care and has seen Dr. AMargaretann Lovelesswith atypical chest pain.  The patient has been on lisinopril HCT for hypertension, hydroxyzine for anxiety in addition to Risperdal and Celexa.   Due to concerns for obstructive sleep apnea with significant loud snoring, he was referred for a sleep study which was done on August 27, 2018.  This revealed severe sleep apnea with an AHI of 37.4, RDI of 79.4, and AHI with supine sleep at 72.9.  The patient was unable to achieve REM sleep in the diagnostic portion of the study.  CPAP was instituted and he was titrated up to 13 cm where AHI was 4.1.  CPAP auto therapy was recommended and he received a ResMed air sense 10 CPAP auto unit with pressure settings with a minimum of 13 up to a maximum of 20.   I saw him for initial sleep evaluation in a telemedicine visit on December 07, 2018.  Since initiating CPAP therapy, he has noticed remarkable benefit.  He now has significantly more energy.  Previously he was having 3-4 episodes of nocturia per night and this is 0-1 time per night.  Previously he would wake up gasping for breath.  His frequent awakenings have resolved and he is now sleeping most of the night.  He typically sleeps 8 hours per night.  A download was obtained since CPAP initiation from March 15 through December 06, 2018.  He is 100% compliant.  He is averaging 8 hours and 55 minutes of sleep per night.  AHI is excellent at 0.9 with his  95th percentile pressure at 14.8 and maximum pressure at 16.  He has noticed disappearance of prior snoring.  He now feels like he is getting into deeper sleep.  He is using a full facemask.  He states he has been getting supplies on [email or web address]   Upon further questions, he was concerned about his lipid status.  He states his primary doctor had taken him off his lovastatin therapy.  He is concerned since laboratory in August 2019 was still elevated.  Total cholesterol was 222, triglycerides 265, HDL 40, VLDL 53, LDL cholesterol 129.  He was advised to improve diet exercise.    He was using CPAP with 100% compliance but his machine malfunction.  As result from mid March he was unable to use CPAP therapy at all until he received a new machine approximately 1 month ago.  Prior to his machine breaking a download from September 21, 2020 had revealed 100% usage until March 17.  He had a CPAP minimum pressure of 13 with maximum pressure of 20 and his 95th percentile pressure was 16.2 with a maximum average pressure of 17.2.    When I  saw him in May 2021 he had received a new machine which has not been linked to our office so I was unable to obtain the most recent download from the new machine.  Presently he goes to bed between  11 PM and midnight.  He is unaware of breakthrough snoring.  Epworth Sleepiness Scale score was calculated in the office today and this endorsed at 15 consistent with residual daytime sleepiness.  I saw him last on April 19, 2020 and over the prior several months he admitted 100% compliance with CPAP therapy and that he "cannot sleep without it.."   Prior to CPAP he was awakening gasping for breath.  Since his last evaluation he had given [place] the serial number of his device but we were still unable to link his machine to our office.  The device number was necessary and this was obtained today with the device number of 606.  As result in the office today we were able to then successfully leak his  CPAP therapy to our office for my review.  A download from July 27 through April 18, 2020 shows 100% compliance with average usage of 8 hours and 21 minutes.  He is set at a minimum pressure of 13 with a maximum pressure of 20.  AHI is excellent at 0.7.  95th percentile pressure is 15.7 with a maximum average pressure of 16.9.  He does have moderate mask leak on most nights.  He has been using a full facemask.  He has not been very successful with weight loss.  During that evaluation, his blood pressure had improved on lisinopril 20 mg daily and HCTZ 12.5 mg for swelling.  Since I last saw him, he has been obtaining his supplies through [email or web address].  He currently has been using a fullface mask F 20.  He continues to use CPAP with 100% compliance.  Download was obtained from December 8 through August 30, 2021.  Usage was 100% with average use of 8 hours and 19 minutes.  He continues to have significant mask leak but despite high leak at 93.0 L/min, AHI continues to be excellent at 1.3.  His pressure setting was set at a range of 13 to 20 cm and his 95th percentile pressure was 15.9 with maximum average pressure of 17.2.  He presents for reevaluation.  Past Medical History:  Diagnosis Date   Anal fistula    Anxiety    Bilateral inguinal hernia 07/2018   Bipolar disorder ([place])    Depression    Heart palpitations    Hyperchloremia    Hyperlipidemia    Hypertension    Obesity    Panic disorder    PTSD (post-traumatic stress disorder)    Pulsatile tinnitus    Sleep apnea    uses CPAP nightly   Tic disorder    Tourette disorder    Vertigo     No past surgical history on file.  Current Medications: Outpatient Medications Prior to Visit  Medication Sig Dispense Refill   citalopram (CELEXA) 10 MG tablet Take 1.5 tablets (15 mg total) by mouth daily. 45 tablet 0   hydrOXYzine (ATARAX) 25 MG tablet Take 25 mg by mouth as needed.     lisinopril (ZESTRIL) 20 MG tablet Take 1 tablet (20 mg total) by  mouth daily. KEEP OV. 90 tablet 3   metoprolol succinate (TOPROL-XL) 25 MG 24 hr tablet Take 1 tablet (25 mg total) by mouth daily. 30 tablet 11   risperiDONE (RISPERDAL) 1 MG tablet Take 1 tablet (1 mg total) by mouth at bedtime. 30 tablet 0   simvastatin (ZOCOR) 40 MG tablet Take 1 tablet (40 mg total) by mouth daily at 6 PM. 90 tablet 3   No facility-administered medications prior  to visit.     Allergies:   Motrin [ibuprofen] and Lactose intolerance (gi)   Social History   Socioeconomic History   Marital status: Single    Spouse name: Not on file   Number of children: Not on file   Years of education: Not on file   Highest education level: Not on file  Occupational History   Not on file  Tobacco Use   Smoking status: Former    Types: Cigars    Quit date: 11/2018    Years since quitting: 2.7   Smokeless tobacco: Current  Vaping Use   Vaping Use: Never used  Substance and Sexual Activity   Alcohol use: Yes    Comment: occasional   Drug use: Not Currently    Types: Marijuana    Comment: Last use 07/2018   Sexual activity: Not on file  Other Topics Concern   Not on file  Social History Narrative   Lives with a male friend   Mother moved to [place] from [place] in recent years.    No children.  Thought he had a daughter, but DNA showed he was not her father.   Social Determinants of Health   Financial Resource Strain: Not on file  Food Insecurity: Not on file  Transportation Needs: Not on file  Physical Activity: Not on file  Stress: Not on file  Social Connections: Not on file     Family History:  The patient's family history includes Alcohol abuse in his father; Bipolar disorder in his mother; Drug abuse in his mother; Heart disease in his mother.   ROS General: Negative; No fevers, chills, or night sweats;  HEENT: Negative; No changes in vision or hearing, sinus congestion, difficulty swallowing Pulmonary: Negative; No cough, wheezing, shortness of  breath, hemoptysis Cardiovascular: Hypertension and hyperlipidemia GI: Negative; No nausea, vomiting, diarrhea, or abdominal pain GU: Negative; No dysuria, hematuria, or difficulty voiding Musculoskeletal: Negative; no myalgias, joint pain, or weakness Hematologic/Oncology: Negative; no easy bruising, bleeding Endocrine: Negative; no heat/cold intolerance; no diabetes Neuro: Negative; no changes in balance, headaches Skin: Negative; No rashes or skin lesions Psychiatric: Negative; No behavioral problems, depression Sleep: Positive for OSA on CPAP therapy" cannot sleep without it." Other comprehensive 14 point system review is negative.   PHYSICAL EXAM:   VS:  BP 124/70    Pulse 63    Ht _0  (1.626 m)    Wt 277 lb (125.6 kg)    SpO2 97%    BMI 47.55 kg/m     Repeat blood pressure by me was 122/70.  Wt Readings from Last 3 Encounters:  09/02/21 277 lb (125.6 kg)  08/27/21 274 lb (124.3 kg)  07/08/21 271 lb (122.9 kg)    General: Alert, oriented, no distress.  Morbid obesity Skin: normal turgor, no rashes, warm and dry HEENT: Normocephalic, atraumatic. Pupils equal round and reactive to light; sclera anicteric; extraocular muscles intact;  Nose without nasal septal hypertrophy Mouth/Parynx benign; Mallinpatti scale 4 Neck: No JVD, no carotid bruits; normal carotid upstroke Lungs: clear to ausculatation and percussion; no wheezing or rales Chest wall: without tenderness to palpitation Heart: PMI not displaced, RRR, s1 s2 normal, 1/6 systolic murmur, no diastolic murmur, no rubs, gallops, thrills, or heaves Abdomen: Central adiposity;soft, nontender; no hepatosplenomehaly, BS+; abdominal aorta nontender and not dilated by palpation. Back: no CVA tenderness Pulses 2+ Musculoskeletal: full range of motion, normal strength, no joint deformities Extremities: Trivial right ankle edema no clubbing cyanosis or edema, Homan's sign negative  Neurologic: grossly nonfocal; Cranial nerves  grossly wnl Psychologic: Normal mood and affect     Studies/Labs Reviewed:   September 02, 2021 ECG (independently read by me):NSR at 22, no ectopy, normlintervals  I personally reviewed his ECG from April 04, 2019 which showed normal sinus rhythm at 96 bpm without ectopy.  Recent Labs: BMP Latest Ref Rng & Units 04/09/2021 03/08/2021 02/09/2020  Glucose 70 - 99 mg/dL 134(H) 84 107(H)  BUN 6 - 20 mg/dL _0 Creatinine 0.61 - 1.24 mg/dL 1.23 1.24 1.20  Sodium 135 - 145 mmol/L 136 136 142  Potassium 3.5 - 5.1 mmol/L 3.8 3.9 4.0  Chloride 98 - 111 mmol/L 104 103 103  CO2 22 - 32 mmol/L 23 26 -  Calcium 8.9 - 10.3 mg/dL 9.1 9.5 -     Hepatic Function Latest Ref Rng & Units 04/09/2021 02/09/2020 06/07/2019  Total Protein 6.5 - 8.1 g/dL 7.0 6.9 7.2  Albumin 3.5 - 5.0 g/dL 4.0 4.1 4.1  AST 15 - 41 U/L 33 35 39  ALT 0 - 44 U/L 35 28 35  Alk Phosphatase 38 - 126 U/L 63 47 49  Total Bilirubin 0.3 - 1.2 mg/dL 0.7 1.2 1.2  Bilirubin, Direct 0.00 - 0.40 mg/dL - - -    CBC Latest Ref Rng & Units 04/09/2021 03/08/2021 02/09/2020  WBC 4.0 - 10.5 K/uL 5.6 4.6 -  Hemoglobin 13.0 - 17.0 g/dL 15.8 16.8 17.3(H)  Hematocrit 39.0 - 52.0 % 49.2 51.1 51.0  Platelets 150 - 400 K/uL 280 277 -   Lab Results  Component Value Date   MCV 93.7 04/09/2021   MCV 93.4 03/08/2021   MCV 94.8 02/09/2020   No results found for: TSH Lab Results  Component Value Date   HGBA1C 4.9 07/08/2021     BNP No results found for: BNP  ProBNP No results found for: PROBNP   Lipid Panel     Component Value Date/Time   CHOL 145 08/20/2021 0933   TRIG 204 (H) 08/20/2021 0933   HDL 35 (L) 08/20/2021 0933   LDLCALC 76 08/20/2021 0933   LABVLDL 34 08/20/2021 0933     RADIOLOGY: No results found.   Additional studies/ records that were reviewed today include:  I reviewed the patient's sleep study.  I reviewed and obtained his download from January 28 through December 20, 2019.  He was 100% compliant until  his machine broke in mid March.  AHI 0.6 at a 95 percentile pressure of 16.2 with maximum average pressure 17.2 centimeters of water  His machine was ultimately linked to our office today and a download was achieved from July 27 through April 18, 2020 which was reviewed with the patient.  New download was obtained from August 01, 2021 through August 30, 2021.  ASSESSMENT:    1. OSA (obstructive sleep apnea)   2. Morbid obesity ([place])   3. Essential hypertension   4. Mixed hyperlipidemia     PLAN:  Samuel Austin is a 41 year old African-American gentleman who has a history of morbid obesity and was found to have severe obstructive sleep apnea on his diagnostic polysomnogram in January 2020 with an AHI of 37.4/h, RDI of 79.4/h and AHI with supine sleep at 72.9/h.  He was unable to achieve any REM sleep on the diagnostic portion of his study.  Since that time he has been on CPAP therapy and has continued to have excellent compliance.  His initial machine  malfunctioned in mid March 2021  and ultimately had to receive a new machine.  Since reinitiating therapy he admits to 100% compliance.  He "cannot sleep without it."  Previously he had significant episodes of gasping for breath, nonrestorative sleep, frequent awakenings, daytime sleepiness, loud snoring and morning headaches.  At his last office visit on April 19, 2020 we were able to link his new machine to our office.  He has consistently had excellent compliance.  His most recent download shows an AHI of 1.3 and his 95th percentile pressure is 15.9 with maximum average of 17.2.  However he has significant mask leak with his F 20 fullface mask.  He no longer gets his supplies from his DME company but gets them through [employer] on her online services.  We will write a prescription for him to receive a new large ResMed AirFit F 30i mask where the tubing will come to the crown of the head and hopefully with this mask style there will be significant  improvement in his very large leak.  We discussed the importance of weight loss and exercise.  Blood pressure today on repeat by me was controlled at 122/70 on his regimen of lisinopril 20 mg and metoprolol succinate 25 mg.  He continues to take simvastatin 20 mg for hyperlipidemia.  I will see him in 1 year for reevaluation or sooner as needed.    Finally able to link his device to her office and his download confirms 100% compliance with excellent benefit with an AHI of 0.7 at his current pressure settings.  His 95th percentile pressure is 15.7cm of water.  His blood pressure today is improved and he continues to be on hydrochlorothiazide 12.5 mg as needed for swelling, lisinopril 20 mg daily.  He is on simvastatin 40 mg for hyperlipidemia.  He continues to be morbidly obese.  We discussed the importance of weight loss.  We discussed the importance of exercising at least 5 days/week with moderate intensity for at least 30 minutes if at all possible.  I have recommended follow-up lipid panel be obtained to reassess lipid studies which were last checked in July 2020.  Recent laboratory in June revealed a creatinine of 1.2 and BUN of 16 current therapy.  I will see him in 6 months for reevaluation.  Medication Adjustments/Labs and Tests Ordered: Current medicines are reviewed at length with the patient today.  Concerns regarding medicines are outlined above.  Medication changes, Labs and Tests ordered today are listed in the Patient Instructions below. Patient Instructions  Medication Instructions:  TAKE NATURES BOUNTY FISH OIL 1,400MG WITH DHA  *If you need a refill on your cardiac medications before your next appointment, please call your pharmacy*  Lab Work:   Testing/Procedures:  NONE    NONE  Special Instructions RESMED F30i CPAP MASK  Follow-Up: Your next appointment:  12 month(s) In Person with Shelva Majestic, MD   Please call our office 2 months in advance to schedule this appointment    :1  At [facility], you and your health needs are our priority.  As part of our continuing mission to provide you with exceptional heart care, we have created designated Provider Care Teams.  These Care Teams include your primary Cardiologist (physician) and Advanced Practice Providers (APPs -  Physician Assistants and Nurse Practitioners) who all work together to provide you with the care you need, when you need it.  We recommend signing up for the patient portal called "MyChart".  Sign up information is provided on this After Visit  Summary.  MyChart is used to connect with patients for Virtual Visits (Telemedicine).  Patients are able to view lab/test results, encounter notes, upcoming appointments, etc.  Non-urgent messages can be sent to your provider as well.   To learn more about what you can do with MyChart, go to [email or web address].       Signed, Shelva Majestic, MD  09/06/2021 6:08 PM    [facility] [address] [place], [place],   [address] Phone: [phone]

## 2021-09-06 ENCOUNTER — Encounter: Payer: Self-pay | Admitting: Cardiovascular Disease

## 2021-09-10 ENCOUNTER — Telehealth: Payer: Self-pay | Admitting: *Deleted

## 2021-09-10 NOTE — Telephone Encounter (Signed)
Order for ReSmed F-30i FF mask faxed to [facility].

## 2021-09-10 NOTE — Telephone Encounter (Signed)
-----   Message from Alyson Ingles, LPN sent at 09/25/9756  4:44 PM EST ----- Regarding: CPAP MASK TK told pt that we might have a "free sample mask to try" can you please check on this? Thank you.

## 2021-09-13 ENCOUNTER — Telehealth: Payer: Self-pay | Admitting: Cardiovascular Disease

## 2021-09-13 NOTE — Telephone Encounter (Signed)
I spoke with the patient and informed him to contact his pharmacy for refills. And he confirmed he understood.

## 2021-09-13 NOTE — Telephone Encounter (Signed)
°*  STAT* If patient is at the pharmacy, call can be transferred to refill team.   1. Which medications need to be refilled? (please list name of each medication and dose if known) simvastatin (ZOCOR) 40 MG tablet  2. Which pharmacy/location (including street and city if local pharmacy) is medication to be sent to?[facility] - [place] ([place]), [place] - [address]  3. Do they need a 30 day or 90 day supply? 90 ds

## 2021-10-24 ENCOUNTER — Other Ambulatory Visit: Payer: Self-pay

## 2021-10-24 ENCOUNTER — Encounter (HOSPITAL_COMMUNITY): Payer: Self-pay | Admitting: Emergency Medicine

## 2021-10-24 ENCOUNTER — Emergency Department (HOSPITAL_COMMUNITY)
Admission: EM | Admit: 2021-10-24 | Discharge: 2021-10-24 | Disposition: A | Discharge status: Home / Self Care | Payer: Self-pay | Attending: Emergency Medicine | Admitting: Emergency Medicine

## 2021-10-24 DIAGNOSIS — M791 Myalgia, unspecified site: Secondary | ICD-10-CM | POA: Insufficient documentation

## 2021-10-24 DIAGNOSIS — R112 Nausea with vomiting, unspecified: Secondary | ICD-10-CM | POA: Insufficient documentation

## 2021-10-24 DIAGNOSIS — Z79899 Other long term (current) drug therapy: Secondary | ICD-10-CM | POA: Insufficient documentation

## 2021-10-24 DIAGNOSIS — R197 Diarrhea, unspecified: Secondary | ICD-10-CM | POA: Insufficient documentation

## 2021-10-24 DIAGNOSIS — I10 Essential (primary) hypertension: Secondary | ICD-10-CM | POA: Insufficient documentation

## 2021-10-24 LAB — COMPREHENSIVE METABOLIC PANEL
ALT: 39 U/L (ref 0–44)
AST: 42 U/L — ABNORMAL HIGH (ref 15–41)
Albumin: 4.5 g/dL (ref 3.5–5.0)
Alkaline Phosphatase: 57 U/L (ref 38–126)
Anion gap: 7 (ref 5–15)
BUN: 13 mg/dL (ref 6–20)
CO2: 23 mmol/L (ref 22–32)
Calcium: 9.2 mg/dL (ref 8.9–10.3)
Chloride: 104 mmol/L (ref 98–111)
Creatinine, Ser: 1.33 mg/dL — ABNORMAL HIGH (ref 0.61–1.24)
GFR, Estimated: >60 mL/min (ref >60)
Glucose, Bld: 96 mg/dL (ref 70–99)
Potassium: 4.7 mmol/L (ref 3.5–5.1)
Sodium: 134 mmol/L — ABNORMAL LOW (ref 135–145)
Total Bilirubin: 1.8 mg/dL — ABNORMAL HIGH (ref 0.3–1.2)
Total Protein: 8.3 g/dL — ABNORMAL HIGH (ref 6.5–8.1)

## 2021-10-24 LAB — CBC
HCT: 56.8 % — ABNORMAL HIGH (ref 39.0–52.0)
Hemoglobin: 18.5 g/dL — ABNORMAL HIGH (ref 13.0–17.0)
MCH: 30.4 pg (ref 26.0–34.0)
MCHC: 32.6 g/dL (ref 30.0–36.0)
MCV: 93.4 fL (ref 80.0–100.0)
Platelets: 292 10*3/uL (ref 150–400)
RBC: 6.08 MIL/uL — ABNORMAL HIGH (ref 4.22–5.81)
RDW: 12.4 % (ref 11.5–15.5)
WBC: 7.1 10*3/uL (ref 4.0–10.5)
nRBC: 0 % (ref 0.0–0.2)

## 2021-10-24 LAB — LIPASE, BLOOD: Lipase: 67 U/L — ABNORMAL HIGH (ref 11–51)

## 2021-10-24 MED ORDER — SODIUM CHLORIDE 0.9 % IV BOLUS
1000.0000 mL | Freq: Once | INTRAVENOUS | Status: AC
Start: 2021-10-24 — End: 2021-10-24
  Administered 2021-10-24: 1000 mL via INTRAVENOUS

## 2021-10-24 MED ORDER — ONDANSETRON 4 MG PO TBDP
4.0000 mg | ORAL_TABLET | Freq: Three times a day (TID) | ORAL | 0 refills | Status: DC | PRN
  Filled 2021-10-24: qty 12, 4d supply, fill #0

## 2021-10-24 MED ORDER — ACETAMINOPHEN 325 MG PO TABS
650.0000 mg | ORAL_TABLET | Freq: Once | ORAL | Status: AC
  Administered 2021-10-24: 650 mg via ORAL
  Filled 2021-10-24: qty 2

## 2021-10-24 MED ORDER — ONDANSETRON 4 MG PO TBDP
4.0000 mg | ORAL_TABLET | Freq: Once | ORAL | Status: AC
  Administered 2021-10-24: 4 mg via ORAL
  Filled 2021-10-24: qty 1

## 2021-10-24 NOTE — Discharge Instructions (Addendum)
Zofran as needed as prescribed for nausea and vomiting.  Follow-up with your doctor if symptoms continue. ?Follow-up in your MyChart account for your stool test results. ?

## 2021-10-24 NOTE — ED Triage Notes (Signed)
Patient here from home reporting "food poisoning" last night. N/v/d since 11pm. Last ate at [employer]. Family member - same.  ?

## 2021-10-24 NOTE — ED Provider Notes (Signed)
?[facility] DEPT ?Provider Note ? ? ?CSN: [id number] ?Arrival date & time: 10/24/21  1213 ? ?  ? ?History ? ?Chief Complaint  ?Patient presents with  ? Nausea  ? Emesis  ? Diarrhea  ? ? ?Samuel Austin is a 41 y.o. male. ? ?41 year old male presents with complaint of nausea, vomiting, diarrhea and body aches.  Patient states his symptoms started after eating a chicken fillet steak at a restaurant last night, states that his girlfriend was with him and developed similar symptoms.  Denies blood in stools or emesis.  Past medical history of hypertension, hyperlipidemia.  No other complaints or concerns today. ? ? ?  ? ?Home Medications ?Prior to Admission medications   ?Medication Sig Start Date End Date Taking? Authorizing Provider  ?ondansetron (ZOFRAN-ODT) 4 MG disintegrating tablet Dissolve 1 tablet (4 mg total) by mouth every 8 (eight) hours as needed for nausea or vomiting. 10/24/21  Yes Tacy Learn, PA-C  ?citalopram (CELEXA) 10 MG tablet Take 1.5 tablets (15 mg total) by mouth daily. 06/06/21 09/02/21  Briant Cedar, MD  ?hydrOXYzine (ATARAX) 25 MG tablet Take 25 mg by mouth as needed.    [provider]  ?lisinopril (ZESTRIL) 20 MG tablet Take 1 tablet (20 mg total) by mouth daily. KEEP OV. 07/08/21 10/06/21  Mack Hook, MD  ?metoprolol succinate (TOPROL-XL) 25 MG 24 hr tablet Take 1 tablet (25 mg total) by mouth daily. 08/27/21   Mack Hook, MD  ?risperiDONE (RISPERDAL) 1 MG tablet Take 1 tablet (1 mg total) by mouth at bedtime. 06/06/21 09/02/21  Briant Cedar, MD  ?simvastatin (ZOCOR) 40 MG tablet Take 1 tablet (40 mg total) by mouth daily at 6 PM. 07/08/21   Mack Hook, MD  ?   ? ?Allergies    ?Motrin [ibuprofen] and Lactose intolerance (gi)   ? ?Review of Systems   ?Review of Systems ?Negative except as per HPI ?Physical Exam ?Updated Vital Signs ?BP 133/80 (BP Location: Left Arm)   Pulse 97   Temp 98 ?F (36.7 ?C) (Oral)    Resp 18   SpO2 97%  ?Physical Exam ?Vitals and nursing note reviewed.  ?Constitutional:   ?   General: He is not in acute distress. ?   Appearance: He is well-developed. He is not diaphoretic.  ?HENT:  ?   Head: Normocephalic and atraumatic.  ?   Mouth/Throat:  ?   Mouth: Mucous membranes are moist.  ?Cardiovascular:  ?   Rate and Rhythm: Normal rate and regular rhythm.  ?   Pulses: Normal pulses.  ?   Heart sounds: Normal heart sounds.  ?Pulmonary:  ?   Effort: Pulmonary effort is normal.  ?   Breath sounds: Normal breath sounds.  ?Abdominal:  ?   Palpations: Abdomen is soft.  ?   Tenderness: There is no abdominal tenderness.  ?Musculoskeletal:  ?   Cervical back: Neck supple.  ?   Right lower leg: No edema.  ?   Left lower leg: No edema.  ?Skin: ?   General: Skin is warm and dry.  ?   Findings: No erythema or rash.  ?Neurological:  ?   Mental Status: He is alert and oriented to person, place, and time.  ?Psychiatric:     ?   Behavior: Behavior normal.  ? ? ?ED Results / Procedures / Treatments   ?Labs ?(all labs ordered are listed, but only abnormal results are displayed) ?Labs Reviewed  ?LIPASE, BLOOD - Abnormal; Notable  for the following components:  ?    Result Value  ? Lipase 67 (*)   ? All other components within normal limits  ?COMPREHENSIVE METABOLIC PANEL - Abnormal; Notable for the following components:  ? Sodium 134 (*)   ? Creatinine, Ser 1.33 (*)   ? Total Protein 8.3 (*)   ? AST 42 (*)   ? Total Bilirubin 1.8 (*)   ? All other components within normal limits  ?CBC - Abnormal; Notable for the following components:  ? RBC 6.08 (*)   ? Hemoglobin 18.5 (*)   ? HCT 56.8 (*)   ? All other components within normal limits  ?GASTROINTESTINAL PANEL BY PCR, STOOL (REPLACES STOOL CULTURE)  ? ? ?EKG ?None ? ?Radiology ?No results found. ? ?Procedures ?Procedures  ? ? ?Medications Ordered in ED ?Medications  ?ondansetron (ZOFRAN-ODT) disintegrating tablet 4 mg (4 mg Oral Given 10/24/21 1354)  ?acetaminophen  (TYLENOL) tablet 650 mg (650 mg Oral Given 10/24/21 1353)  ?sodium chloride 0.9 % bolus 1,000 mL (1,000 mLs Intravenous New Bag/Given 10/24/21 1446)  ? ? ?ED Course/ Medical Decision Making/ A&P ?  ?                        ?Medical Decision Making ?Amount and/or Complexity of Data Reviewed ?Labs: ordered. ? ? ?41 year old male with complaint of nausea, vomiting, diarrhea after eating at a restaurant last night.  States girlfriend with similar symptoms.  At this time, he is well-appearing, his abdomen is soft and nontender, no active vomiting.  Plan is to assess labs, IV fluids, given Zofran and will p.o. challenge.  If able to tolerate p.o.'s with fairly unremarkable lab changes will expect discharge home. ? ? ? ? ? ? ? ?Final Clinical Impression(s) / ED Diagnoses ?Final diagnoses:  ?Nausea vomiting and diarrhea  ? ? ?Rx / DC Orders ?ED Discharge Orders   ? ?      Ordered  ?  ondansetron (ZOFRAN-ODT) 4 MG disintegrating tablet  Every 8 hours PRN       ? 10/24/21 1445  ? ?  ?  ? ?  ? ? ?  ?Tacy Learn, PA-C ?10/24/21 1521 ? ?  ?Valarie Merino, MD ?10/25/21 [phone] ? ?

## 2021-10-24 NOTE — ED Provider Triage Note (Signed)
Emergency Medicine Provider Triage Evaluation Note ? ?Samuel Austin , a 41 y.o. male  was evaluated in triage.  Pt complains of nausea, vomiting, diarrhea, headaches, bodyaches after eating some pizza last night. Fiance with similar symptoms. Large amount of watery, yellowish, foul smelling stool. ? ?Review of Systems  ?Positive: Nausea, vomiting, diarrhea, aches ?Negative: Chest pain, sob, blood in stool / vomit ? ?Physical Exam  ?BP (!) 166/98 (BP Location: Left Arm)   Pulse (!) 106   Temp 98 ?F (36.7 ?C) (Oral)   Resp 19   SpO2 95%  ?Gen:   Awake, no distress   ?Resp:  Normal effort  ?MSK:   Moves extremities without difficulty  ?Other:  No significant TTP abdomen ? ?Medical Decision Making  ?Medically screening exam initiated at 12:29 PM.  Appropriate orders placed.  Samuel Austin was informed that the remainder of the evaluation will be completed by another provider, this initial triage assessment does not replace that evaluation, and the importance of remaining in the ED until their evaluation is complete. ? ?Workup initiated ?  ?Anselmo Pickler, PA-C ?10/24/21 1230 ? ?

## 2021-10-25 LAB — GASTROINTESTINAL PANEL BY PCR, STOOL (REPLACES STOOL CULTURE)

## 2021-10-31 ENCOUNTER — Other Ambulatory Visit: Payer: Self-pay

## 2021-11-05 ENCOUNTER — Encounter: Payer: Self-pay | Admitting: Internal Medicine

## 2021-12-26 ENCOUNTER — Emergency Department (HOSPITAL_COMMUNITY)
Admission: EM | Admit: 2021-12-26 | Discharge: 2021-12-26 | Disposition: A | Discharge status: Home / Self Care | Payer: Self-pay | Attending: Emergency Medicine | Admitting: Emergency Medicine

## 2021-12-26 ENCOUNTER — Other Ambulatory Visit: Payer: Self-pay

## 2021-12-26 ENCOUNTER — Encounter (HOSPITAL_COMMUNITY): Payer: Self-pay

## 2021-12-26 DIAGNOSIS — N179 Acute kidney failure, unspecified: Secondary | ICD-10-CM | POA: Insufficient documentation

## 2021-12-26 DIAGNOSIS — R112 Nausea with vomiting, unspecified: Secondary | ICD-10-CM | POA: Insufficient documentation

## 2021-12-26 DIAGNOSIS — R197 Diarrhea, unspecified: Secondary | ICD-10-CM | POA: Insufficient documentation

## 2021-12-26 DIAGNOSIS — R42 Dizziness and giddiness: Secondary | ICD-10-CM | POA: Insufficient documentation

## 2021-12-26 DIAGNOSIS — M791 Myalgia, unspecified site: Secondary | ICD-10-CM | POA: Insufficient documentation

## 2021-12-26 DIAGNOSIS — R109 Unspecified abdominal pain: Secondary | ICD-10-CM | POA: Insufficient documentation

## 2021-12-26 LAB — COMPREHENSIVE METABOLIC PANEL
ALT: 54 U/L — ABNORMAL HIGH (ref 0–44)
AST: 39 U/L (ref 15–41)
Albumin: 4.6 g/dL (ref 3.5–5.0)
Alkaline Phosphatase: 51 U/L (ref 38–126)
Anion gap: 11 (ref 5–15)
BUN: 25 mg/dL — ABNORMAL HIGH (ref 6–20)
CO2: 16 mmol/L — ABNORMAL LOW (ref 22–32)
Calcium: 9.3 mg/dL (ref 8.9–10.3)
Chloride: 109 mmol/L (ref 98–111)
Creatinine, Ser: 1.57 mg/dL — ABNORMAL HIGH (ref 0.61–1.24)
GFR, Estimated: 56 mL/min — ABNORMAL LOW (ref >60)
Glucose, Bld: 123 mg/dL — ABNORMAL HIGH (ref 70–99)
Potassium: 3.8 mmol/L (ref 3.5–5.1)
Sodium: 136 mmol/L (ref 135–145)
Total Bilirubin: 1.4 mg/dL — ABNORMAL HIGH (ref 0.3–1.2)
Total Protein: 8.9 g/dL — ABNORMAL HIGH (ref 6.5–8.1)

## 2021-12-26 LAB — URINALYSIS, ROUTINE W REFLEX MICROSCOPIC
Bilirubin Urine: NEGATIVE
Glucose, UA: NEGATIVE mg/dL
Ketones, ur: NEGATIVE mg/dL
Leukocytes,Ua: NEGATIVE
Nitrite: NEGATIVE
Protein, ur: 100 mg/dL — AB
Specific Gravity, Urine: 1.027 (ref 1.005–1.030)
pH: 5 (ref 5.0–8.0)

## 2021-12-26 LAB — LIPASE, BLOOD: Lipase: 29 U/L (ref 11–51)

## 2021-12-26 LAB — CBC WITH DIFFERENTIAL/PLATELET
Abs Immature Granulocytes: 0.02 10*3/uL (ref 0.00–0.07)
Basophils Absolute: 0 10*3/uL (ref 0.0–0.1)
Basophils Relative: 1 %
Eosinophils Absolute: 0 10*3/uL (ref 0.0–0.5)
Eosinophils Relative: 1 %
HCT: 58.4 % — ABNORMAL HIGH (ref 39.0–52.0)
Hemoglobin: 20.2 g/dL — ABNORMAL HIGH (ref 13.0–17.0)
Immature Granulocytes: 0 %
Lymphocytes Relative: 24 %
Lymphs Abs: 1.5 10*3/uL (ref 0.7–4.0)
MCH: 31.4 pg (ref 26.0–34.0)
MCHC: 34.6 g/dL (ref 30.0–36.0)
MCV: 90.8 fL (ref 80.0–100.0)
Monocytes Absolute: 1 10*3/uL (ref 0.1–1.0)
Monocytes Relative: 16 %
Neutro Abs: 3.6 10*3/uL (ref 1.7–7.7)
Neutrophils Relative %: 58 %
Platelets: 259 10*3/uL (ref 150–400)
RBC: 6.43 MIL/uL — ABNORMAL HIGH (ref 4.22–5.81)
RDW: 12.5 % (ref 11.5–15.5)
WBC: 6.1 10*3/uL (ref 4.0–10.5)
nRBC: 0 % (ref 0.0–0.2)

## 2021-12-26 MED ORDER — ONDANSETRON HCL 4 MG/2ML IJ SOLN
4.0000 mg | Freq: Once | INTRAMUSCULAR | Status: DC
  Filled 2021-12-26: qty 2

## 2021-12-26 MED ORDER — LOPERAMIDE HCL 2 MG PO CAPS: 2.0000 mg | ORAL_CAPSULE | Freq: Four times a day (QID) | ORAL | 0 refills | Status: DC | PRN

## 2021-12-26 MED ORDER — LOPERAMIDE HCL 2 MG PO CAPS
4.0000 mg | ORAL_CAPSULE | Freq: Once | ORAL | Status: AC
  Administered 2021-12-26: 4 mg via ORAL
  Filled 2021-12-26: qty 2

## 2021-12-26 MED ORDER — SODIUM CHLORIDE 0.9 % IV BOLUS: 1000.0000 mL | Freq: Once | INTRAVENOUS | Status: DC

## 2021-12-26 MED ORDER — ONDANSETRON 4 MG PO TBDP
4.0000 mg | ORAL_TABLET | Freq: Once | ORAL | Status: AC
  Administered 2021-12-26: 4 mg via ORAL
  Filled 2021-12-26: qty 1

## 2021-12-26 MED ORDER — ONDANSETRON HCL 4 MG PO TABS: 4.0000 mg | ORAL_TABLET | Freq: Four times a day (QID) | ORAL | 0 refills | Status: DC

## 2021-12-26 NOTE — ED Triage Notes (Addendum)
Patient reports that he has been having N/V/D, generalized body aches, chills that started  4 days. ?Patient states that the vomiting stopped 2 days ago, but has had "nonstop diarrhea." ? ?Patient states he went to the fair 5 days ago and had a [place] leg, fried, and a funnel cake then his symptoms started the next day. ?

## 2021-12-26 NOTE — Discharge Instructions (Signed)
Stay hydrated. ? ?Take Zofran for nausea ? ?Take Imodium 4 mg every time you have diarrhea but no more than 10 tablets a day ? ?You likely have a stomach virus ? ?Return to ER if you have worse abdominal pain or vomiting or dehydration or fevers ?

## 2021-12-26 NOTE — ED Provider Notes (Signed)
?[facility] DEPT ?Provider Note ? ? ?CSN: [id number] ?Arrival date & time: 12/26/21  1259 ? ?  ? ?History ? ?Chief Complaint  ?Patient presents with  ? Emesis  ? Dizziness  ? Generalized Body Aches  ? Diarrhea  ? Chills  ? Abdominal Pain  ? ? ?Davy Deberg is a 41 y.o. male here presenting with vomiting and abdominal pain.  Patient states that he went to the fair on Saturday.  He states that he was around a lot of people and ate funnel cake and [place] leg and fried food.  He states that the day after, he had started having vomiting and diarrhea.  He states that he vomited 2-3 times a day but had numerous episodes of diarrhea.  He states that he tried some over-the-counter Pepto-Bismol with no relief.  Patient also has some chills as well.  Patient has no history of C. difficile and not recently biotics.  Patient states that for the last 5 days he has been having persistent symptoms so decided to come here for evaluation.  Denies any recent travel. ? ?The history is provided by the patient.  ? ?  ? ?Home Medications ?Prior to Admission medications   ?Medication Sig Start Date End Date Taking? Authorizing Provider  ?citalopram (CELEXA) 10 MG tablet Take 1.5 tablets (15 mg total) by mouth daily. 06/06/21 09/02/21  Briant Cedar, MD  ?hydrOXYzine (ATARAX) 25 MG tablet Take 25 mg by mouth as needed.    [provider]  ?lisinopril (ZESTRIL) 20 MG tablet Take 1 tablet (20 mg total) by mouth daily. KEEP OV. 07/08/21 10/06/21  Mack Hook, MD  ?metoprolol succinate (TOPROL-XL) 25 MG 24 hr tablet Take 1 tablet (25 mg total) by mouth daily. 08/27/21   Mack Hook, MD  ?ondansetron (ZOFRAN-ODT) 4 MG disintegrating tablet Dissolve 1 tablet (4 mg total) by mouth every 8 (eight) hours as needed for nausea or vomiting. 10/24/21   Tacy Learn, PA-C  ?risperiDONE (RISPERDAL) 1 MG tablet Take 1 tablet (1 mg total) by mouth at bedtime. 06/06/21 09/02/21  Briant Cedar, MD  ?simvastatin (ZOCOR) 40 MG tablet Take 1 tablet (40 mg total) by mouth daily at 6 PM. 07/08/21   Mack Hook, MD  ?   ? ?Allergies    ?Motrin [ibuprofen] and Lactose intolerance (gi)   ? ?Review of Systems   ?Review of Systems  ?Gastrointestinal:  Positive for diarrhea and vomiting.  ?Neurological:  Positive for dizziness.  ?All other systems reviewed and are negative. ? ?Physical Exam ?Updated Vital Signs ?BP (!) 123/94   Pulse (!) 105   Temp 98.5 ?F (36.9 ?C) (Oral)   Resp 20   Ht 5\' 4"  (1.626 m)   Wt 113.8 kg   SpO2 92%   BMI 43.05 kg/m?  ?Physical Exam ?Vitals and nursing note reviewed.  ?Constitutional:   ?   Comments: Slightly uncomfortable  ?HENT:  ?   Head: Normocephalic.  ?   Mouth/Throat:  ?   Pharynx: Oropharynx is clear.  ?   Comments: Mucous membranes slightly dry ?Eyes:  ?   Extraocular Movements: Extraocular movements intact.  ?   Pupils: Pupils are equal, round, and reactive to light.  ?Cardiovascular:  ?   Rate and Rhythm: Normal rate and regular rhythm.  ?   Heart sounds: Normal heart sounds.  ?Pulmonary:  ?   Effort: Pulmonary effort is normal.  ?   Breath sounds: Normal breath sounds.  ?Abdominal:  ?  General: Abdomen is flat. Bowel sounds are normal.  ?   Palpations: Abdomen is soft.  ?Skin: ?   General: Skin is warm.  ?   Capillary Refill: Capillary refill takes less than 2 seconds.  ?Neurological:  ?   General: No focal deficit present.  ?   Mental Status: He is oriented to person, place, and time.  ?Psychiatric:     ?   Mood and Affect: Mood normal.     ?   Behavior: Behavior normal.  ? ? ?ED Results / Procedures / Treatments   ?Labs ?(all labs ordered are listed, but only abnormal results are displayed) ?Labs Reviewed  ?CBC WITH DIFFERENTIAL/PLATELET - Abnormal; Notable for the following components:  ?    Result Value  ? RBC 6.43 (*)   ? Hemoglobin 20.2 (*)   ? HCT 58.4 (*)   ? All other components within normal limits  ?COMPREHENSIVE METABOLIC PANEL -  Abnormal; Notable for the following components:  ? CO2 16 (*)   ? Glucose, Bld 123 (*)   ? BUN 25 (*)   ? Creatinine, Ser 1.57 (*)   ? Total Protein 8.9 (*)   ? ALT 54 (*)   ? Total Bilirubin 1.4 (*)   ? GFR, Estimated 56 (*)   ? All other components within normal limits  ?URINALYSIS, ROUTINE W REFLEX MICROSCOPIC - Abnormal; Notable for the following components:  ? APPearance HAZY (*)   ? Hgb urine dipstick SMALL (*)   ? Protein, ur 100 (*)   ? Bacteria, UA RARE (*)   ? All other components within normal limits  ?GASTROINTESTINAL PANEL BY PCR, STOOL (REPLACES STOOL CULTURE)  ?LIPASE, BLOOD  ? ? ?EKG ?None ? ?Radiology ?No results found. ? ?Procedures ?Procedures  ? ? ?Medications Ordered in ED ?Medications  ?loperamide (IMODIUM) capsule 4 mg (4 mg Oral Given 12/26/21 2117)  ?ondansetron (ZOFRAN-ODT) disintegrating tablet 4 mg (4 mg Oral Given 12/26/21 2117)  ? ? ?ED Course/ Medical Decision Making/ A&P ?  ?                        ?Medical Decision Making ?Ahyan Mosbrucker is a 41 y.o. male here presenting with diarrhea and vomiting.  I think he likely has viral gastroenteritis.  Patient went to the fair and then started having the symptoms.  Plan to check CBC and CMP and lipase and urinalysis.  We will give IV fluids and Zofran and reassess ? ?9:39 PM ?Labs show mild AKI with creatinine 1.5.  Lipase is normal.  Patient had sinus tachycardia on EKG.  I ordered IV fluids but patient states that he does not want to wait has been tolerating oral fluids.  Given Zofran and Imodium.  I did encourage him to stay hydrated and take Zofran as needed.  Gave strict return precautions ? ? ?Problems Addressed: ?Nausea vomiting and diarrhea: acute illness or injury ? ?Amount and/or Complexity of Data Reviewed ?Labs: ordered. Decision-making details documented in ED Course. ?Radiology: ordered and independent interpretation performed. ? ?Risk ?Prescription drug management. ? ? ?Final Clinical Impression(s) / ED Diagnoses ?Final  diagnoses:  ?None  ? ? ?Rx / DC Orders ?ED Discharge Orders   ? ? None  ? ?  ? ? ?  ?Drenda Freeze, MD ?12/26/21 2140 ? ?

## 2021-12-26 NOTE — ED Notes (Signed)
Pt stated he could not wait for IV fluids. Pt stated that he has to get home soon due to family arrangements. I informed Dr.Yao. Pt was provided with a pitcher of water and was encouraged to get some fluid intake. ?

## 2021-12-26 NOTE — ED Provider Triage Note (Signed)
Emergency Medicine Provider Triage Evaluation Note ? ?Samuel Austin , a 40 y.o. male  was evaluated in triage.  Pt complains of profuse watery diarrhea since Sunday.  Patient states that he went to the fair on Saturday and ate fair food including [place] leg and funnel cake.  On Sunday he began to have nonstop diarrhea with generalized body aches, chills.  Sunday night and into Monday he also experienced multiple episodes of emesis.  The vomiting stopped after Monday.  He continues to have profuse diarrhea.  Denies frank abdominal pain ? ?Review of Systems  ?Positive: Nausea, diarrhea ?Negative: Abdominal pain, shortness of breath, chest pain ? ?Physical Exam  ?BP (!) 165/106 (BP Location: Left Arm)   Pulse (!) 112   Temp 98.5 ?F (36.9 ?C) (Oral)   Resp 16   Ht 5\' 4"  (1.626 m)   Wt 113.8 kg   SpO2 98%   BMI 43.05 kg/m?  ?Gen:   Awake, no distress   ?Resp:  Normal effort  ?MSK:   Moves extremities without difficulty  ?Other:   ? ?Medical Decision Making  ?Medically screening exam initiated at 1:23 PM.  Appropriate orders placed.  Samuel Austin was informed that the remainder of the evaluation will be completed by another provider, this initial triage assessment does not replace that evaluation, and the importance of remaining in the ED until their evaluation is complete. ? ? ?  ?Donnetta Simpers, PA-C ?12/26/21 1330 ? ?

## 2021-12-26 NOTE — ED Notes (Signed)
I provided reinforced discharge education based off of discharge instructions. Pt acknowledged and understood my education. Pt had no further questions/concerns for provider/myself.  °

## 2021-12-27 LAB — GASTROINTESTINAL PANEL BY PCR, STOOL (REPLACES STOOL CULTURE)

## 2021-12-28 ENCOUNTER — Emergency Department (HOSPITAL_COMMUNITY)
Admission: EM | Admit: 2021-12-28 | Discharge: 2021-12-28 | Disposition: A | Discharge status: Home / Self Care | Payer: Self-pay | Attending: Emergency Medicine | Admitting: Emergency Medicine

## 2021-12-28 ENCOUNTER — Encounter (HOSPITAL_COMMUNITY): Payer: Self-pay

## 2021-12-28 ENCOUNTER — Other Ambulatory Visit: Payer: Self-pay

## 2021-12-28 DIAGNOSIS — S61217A Laceration without foreign body of left little finger without damage to nail, initial encounter: Secondary | ICD-10-CM | POA: Insufficient documentation

## 2021-12-28 DIAGNOSIS — Z5321 Procedure and treatment not carried out due to patient leaving prior to being seen by health care provider: Secondary | ICD-10-CM | POA: Insufficient documentation
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
## 2021-12-28 NOTE — ED Notes (Signed)
No answer from pt in waiting room 

## 2022-01-28 ENCOUNTER — Telehealth: Payer: Self-pay | Admitting: Cardiovascular Disease

## 2022-01-28 NOTE — Telephone Encounter (Signed)
Patient called and said he needs financial assistance to get a new mask and hose for his CPAP machine. He can not afford it at this time and he can not use the machine without the new parts. Please let the patient know if the office is able to help him

## 2022-01-28 NOTE — Telephone Encounter (Signed)
Called patient to come by the office to get a sample mask. I will try to get him a hose from the sleep lab.

## 2022-01-29 ENCOUNTER — Emergency Department (HOSPITAL_COMMUNITY)
Admission: EM | Admit: 2022-01-29 | Discharge: 2022-01-29 | Disposition: A | Discharge status: Home / Self Care | Payer: Self-pay | Attending: Emergency Medicine | Admitting: Emergency Medicine

## 2022-01-29 ENCOUNTER — Encounter (HOSPITAL_COMMUNITY): Payer: Self-pay | Admitting: Emergency Medicine

## 2022-01-29 ENCOUNTER — Other Ambulatory Visit: Payer: Self-pay

## 2022-01-29 DIAGNOSIS — G44209 Tension-type headache, unspecified, not intractable: Secondary | ICD-10-CM | POA: Insufficient documentation

## 2022-01-29 DIAGNOSIS — I10 Essential (primary) hypertension: Secondary | ICD-10-CM | POA: Insufficient documentation

## 2022-01-29 DIAGNOSIS — Z79899 Other long term (current) drug therapy: Secondary | ICD-10-CM | POA: Insufficient documentation

## 2022-01-29 LAB — BASIC METABOLIC PANEL
Anion gap: 7 (ref 5–15)
BUN: 12 mg/dL (ref 6–20)
CO2: 23 mmol/L (ref 22–32)
Calcium: 9.3 mg/dL (ref 8.9–10.3)
Chloride: 108 mmol/L (ref 98–111)
Creatinine, Ser: 1.12 mg/dL (ref 0.61–1.24)
GFR, Estimated: >60 mL/min (ref >60)
Glucose, Bld: 89 mg/dL (ref 70–99)
Potassium: 4.2 mmol/L (ref 3.5–5.1)
Sodium: 138 mmol/L (ref 135–145)

## 2022-01-29 LAB — CBC WITH DIFFERENTIAL/PLATELET
Abs Immature Granulocytes: 0.02 10*3/uL (ref 0.00–0.07)
Basophils Absolute: 0 10*3/uL (ref 0.0–0.1)
Basophils Relative: 1 %
Eosinophils Absolute: 0.1 10*3/uL (ref 0.0–0.5)
Eosinophils Relative: 2 %
HCT: 52.6 % — ABNORMAL HIGH (ref 39.0–52.0)
Hemoglobin: 17.1 g/dL — ABNORMAL HIGH (ref 13.0–17.0)
Immature Granulocytes: 0 %
Lymphocytes Relative: 30 %
Lymphs Abs: 1.8 10*3/uL (ref 0.7–4.0)
MCH: 30.2 pg (ref 26.0–34.0)
MCHC: 32.5 g/dL (ref 30.0–36.0)
MCV: 92.9 fL (ref 80.0–100.0)
Monocytes Absolute: 0.6 10*3/uL (ref 0.1–1.0)
Monocytes Relative: 11 %
Neutro Abs: 3.4 10*3/uL (ref 1.7–7.7)
Neutrophils Relative %: 56 %
Platelets: 297 10*3/uL (ref 150–400)
RBC: 5.66 MIL/uL (ref 4.22–5.81)
RDW: 13.1 % (ref 11.5–15.5)
WBC: 6.1 10*3/uL (ref 4.0–10.5)
nRBC: 0 % (ref 0.0–0.2)

## 2022-01-29 NOTE — ED Triage Notes (Signed)
Pt reports waking up with headache, dizziness worse with movement, dry mouth, and feeling faint today. Felt normal last night. Took Tylenol without relief.

## 2022-01-29 NOTE — ED Notes (Signed)
Pt ambulated in hallway with staff supervision, pt reports pressure on top of head.
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
## 2022-01-29 NOTE — Discharge Instructions (Signed)
Take your blood pressure medication.  Follow up with your Physician for recheck

## 2022-01-29 NOTE — ED Notes (Signed)
Pt ambulated in hallway with steady gait. Pt c/o head pressure when walking.

## 2022-01-29 NOTE — ED Notes (Signed)
Patient verbalizes understanding of discharge instructions. Opportunity for questioning and answers were provided. Armband removed by staff, pt discharged from ED. Pt ambulatory to ED waiting room. 

## 2022-01-29 NOTE — ED Provider Triage Note (Signed)
Emergency Medicine Provider Triage Evaluation Note  Samuel Austin , a 41 y.o. male  was evaluated in triage.  Pt complains of dizziness.  Feels like he is going to pass out, does not feel like the room is spinning.  Associated with dry mouth, generalized weakness and a headache.  He has had headaches before but this headache feels worse than his normal headaches.  He woke up with it, its been constant.  No nausea or vomiting..  Review of Systems  Per HPI  Physical Exam  BP (!) 155/101 (BP Location: Right Arm)   Pulse 79   Temp 98.9 F (37.2 C) (Oral)   Resp 18   SpO2 95%  Gen:   Awake, no distress   Resp:  Normal effort  MSK:   Moves extremities without difficulty  Other:  S1-S2, cranial nerves III through XII are grossly intact.  grip strength equal bilaterally  Medical Decision Making  Medically screening exam initiated at 2:00 PM.  Appropriate orders placed.  Eliseo Withers was informed that the remainder of the evaluation will be completed by another provider, this initial triage assessment does not replace that evaluation, and the importance of remaining in the ED until their evaluation is complete.     Theron Arista, PA-C 01/29/22 1402

## 2022-01-29 NOTE — ED Notes (Signed)
Pt provided with crackers and water.

## 2022-02-11 ENCOUNTER — Emergency Department (HOSPITAL_COMMUNITY)
Admission: EM | Admit: 2022-02-11 | Discharge: 2022-02-12 | Discharge status: Against Medical Advice | Payer: Self-pay | Attending: Emergency Medicine | Admitting: Emergency Medicine

## 2022-02-11 DIAGNOSIS — R42 Dizziness and giddiness: Secondary | ICD-10-CM | POA: Insufficient documentation

## 2022-02-11 DIAGNOSIS — Z5321 Procedure and treatment not carried out due to patient leaving prior to being seen by health care provider: Secondary | ICD-10-CM | POA: Insufficient documentation

## 2022-02-11 DIAGNOSIS — R519 Headache, unspecified: Secondary | ICD-10-CM | POA: Insufficient documentation
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
## 2022-02-12 NOTE — ED Notes (Signed)
Pt refused labs.  

## 2022-02-12 NOTE — ED Notes (Signed)
Patient called for vitals recheck x2 with o respone and not visible in the lobby

## 2022-02-13 ENCOUNTER — Emergency Department (HOSPITAL_COMMUNITY): Payer: Self-pay

## 2022-02-13 ENCOUNTER — Encounter (HOSPITAL_COMMUNITY): Payer: Self-pay | Admitting: Emergency Medicine

## 2022-02-13 ENCOUNTER — Emergency Department (HOSPITAL_COMMUNITY)
Admission: EM | Admit: 2022-02-13 | Discharge: 2022-02-13 | Discharge status: Against Medical Advice | Payer: Self-pay | Attending: Emergency Medicine | Admitting: Emergency Medicine

## 2022-02-13 DIAGNOSIS — R0789 Other chest pain: Secondary | ICD-10-CM | POA: Insufficient documentation

## 2022-02-13 DIAGNOSIS — Z5321 Procedure and treatment not carried out due to patient leaving prior to being seen by health care provider: Secondary | ICD-10-CM | POA: Insufficient documentation

## 2022-02-13 DIAGNOSIS — I10 Essential (primary) hypertension: Secondary | ICD-10-CM | POA: Insufficient documentation

## 2022-02-13 DIAGNOSIS — R42 Dizziness and giddiness: Secondary | ICD-10-CM | POA: Insufficient documentation

## 2022-02-13 LAB — DIFFERENTIAL
Abs Immature Granulocytes: 0.02 10*3/uL (ref 0.00–0.07)
Basophils Absolute: 0 10*3/uL (ref 0.0–0.1)
Basophils Relative: 1 %
Eosinophils Absolute: 0.1 10*3/uL (ref 0.0–0.5)
Eosinophils Relative: 2 %
Immature Granulocytes: 0 %
Lymphocytes Relative: 35 %
Lymphs Abs: 2.3 10*3/uL (ref 0.7–4.0)
Monocytes Absolute: 0.7 10*3/uL (ref 0.1–1.0)
Monocytes Relative: 10 %
Neutro Abs: 3.5 10*3/uL (ref 1.7–7.7)
Neutrophils Relative %: 52 %

## 2022-02-13 LAB — COMPREHENSIVE METABOLIC PANEL
ALT: 27 U/L (ref 0–44)
AST: 32 U/L (ref 15–41)
Albumin: 3.8 g/dL (ref 3.5–5.0)
Alkaline Phosphatase: 56 U/L (ref 38–126)
Anion gap: 9 (ref 5–15)
BUN: 12 mg/dL (ref 6–20)
CO2: 23 mmol/L (ref 22–32)
Calcium: 9.1 mg/dL (ref 8.9–10.3)
Chloride: 106 mmol/L (ref 98–111)
Creatinine, Ser: 1.19 mg/dL (ref 0.61–1.24)
GFR, Estimated: >60 mL/min (ref >60)
Glucose, Bld: 102 mg/dL — ABNORMAL HIGH (ref 70–99)
Potassium: 3.9 mmol/L (ref 3.5–5.1)
Sodium: 138 mmol/L (ref 135–145)
Total Bilirubin: 1.4 mg/dL — ABNORMAL HIGH (ref 0.3–1.2)
Total Protein: 6.5 g/dL (ref 6.5–8.1)

## 2022-02-13 LAB — CBC
HCT: 50.3 % (ref 39.0–52.0)
Hemoglobin: 16.3 g/dL (ref 13.0–17.0)
MCH: 30.5 pg (ref 26.0–34.0)
MCHC: 32.4 g/dL (ref 30.0–36.0)
MCV: 94.2 fL (ref 80.0–100.0)
Platelets: 274 10*3/uL (ref 150–400)
RBC: 5.34 MIL/uL (ref 4.22–5.81)
RDW: 12.9 % (ref 11.5–15.5)
WBC: 6.5 10*3/uL (ref 4.0–10.5)
nRBC: 0 % (ref 0.0–0.2)
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
## 2022-02-13 NOTE — ED Triage Notes (Signed)
Patient here for evaluation of dizziness that started earlier today. Patient is alert, oriented, and in no apparent distress at this time.

## 2022-02-21 ENCOUNTER — Other Ambulatory Visit: Payer: Self-pay

## 2022-02-26 ENCOUNTER — Other Ambulatory Visit: Payer: Self-pay

## 2022-03-04 ENCOUNTER — Other Ambulatory Visit: Payer: Self-pay

## 2022-03-06 ENCOUNTER — Other Ambulatory Visit: Payer: Self-pay

## 2022-03-07 ENCOUNTER — Ambulatory Visit: Payer: Self-pay | Admitting: Internal Medicine

## 2022-09-06 ENCOUNTER — Emergency Department (HOSPITAL_COMMUNITY): Payer: Self-pay

## 2022-09-06 ENCOUNTER — Other Ambulatory Visit: Payer: Self-pay

## 2022-09-06 ENCOUNTER — Emergency Department (HOSPITAL_COMMUNITY)
Admission: EM | Admit: 2022-09-06 | Discharge: 2022-09-07 | Discharge status: Against Medical Advice | Payer: Self-pay | Attending: Emergency Medicine | Admitting: Emergency Medicine

## 2022-09-06 DIAGNOSIS — Z5321 Procedure and treatment not carried out due to patient leaving prior to being seen by health care provider: Secondary | ICD-10-CM | POA: Insufficient documentation

## 2022-09-06 DIAGNOSIS — I10 Essential (primary) hypertension: Secondary | ICD-10-CM | POA: Insufficient documentation

## 2022-09-06 DIAGNOSIS — R42 Dizziness and giddiness: Secondary | ICD-10-CM | POA: Insufficient documentation

## 2022-09-06 DIAGNOSIS — R519 Headache, unspecified: Secondary | ICD-10-CM | POA: Insufficient documentation

## 2022-09-06 LAB — URINALYSIS, ROUTINE W REFLEX MICROSCOPIC
Bacteria, UA: NONE SEEN
Bilirubin Urine: NEGATIVE
Glucose, UA: NEGATIVE mg/dL
Ketones, ur: NEGATIVE mg/dL
Leukocytes,Ua: NEGATIVE
Nitrite: NEGATIVE
Protein, ur: NEGATIVE mg/dL
Specific Gravity, Urine: 1.026 (ref 1.005–1.030)
pH: 5 (ref 5.0–8.0)

## 2022-09-06 LAB — CBC WITH DIFFERENTIAL/PLATELET
Abs Immature Granulocytes: 0.01 K/uL (ref 0.00–0.07)
Basophils Absolute: 0 K/uL (ref 0.0–0.1)
Basophils Relative: 1 %
Eosinophils Absolute: 0.1 K/uL (ref 0.0–0.5)
Eosinophils Relative: 2 %
HCT: 50.6 % (ref 39.0–52.0)
Hemoglobin: 16.5 g/dL (ref 13.0–17.0)
Immature Granulocytes: 0 %
Lymphocytes Relative: 39 %
Lymphs Abs: 2.4 K/uL (ref 0.7–4.0)
MCH: 30.2 pg (ref 26.0–34.0)
MCHC: 32.6 g/dL (ref 30.0–36.0)
MCV: 92.5 fL (ref 80.0–100.0)
Monocytes Absolute: 0.6 K/uL (ref 0.1–1.0)
Monocytes Relative: 9 %
Neutro Abs: 3.1 K/uL (ref 1.7–7.7)
Neutrophils Relative %: 49 %
Platelets: 284 K/uL (ref 150–400)
RBC: 5.47 MIL/uL (ref 4.22–5.81)
RDW: 12 % (ref 11.5–15.5)
WBC: 6.3 K/uL (ref 4.0–10.5)
nRBC: 0 % (ref 0.0–0.2)

## 2022-09-06 NOTE — ED Triage Notes (Signed)
Pt arrives with c/o dizziness and headache that started today. Pt endorses a "hot flash" feeling. Pt was late taking BP meds today.

## 2022-09-07 LAB — TROPONIN I (HIGH SENSITIVITY): Troponin I (High Sensitivity): 8 ng/L (ref <18)

## 2022-09-07 NOTE — ED Notes (Signed)
Pt stated he was going to go head and leave and just check his my chart. Informed pt all of his results are not back yet so they will not be upload into his chart until then. Pt stated "I only came for my blood pressure and it's going down so I'm just going to go and check my mychart in the morning". Pt walked back out into the waiting room and left

## 2022-11-17 ENCOUNTER — Other Ambulatory Visit: Payer: Self-pay

## 2022-11-17 MED ORDER — LISINOPRIL 20 MG PO TABS: 20.0000 mg | ORAL_TABLET | Freq: Every day | ORAL | 0 refills | Status: DC

## 2022-12-04 ENCOUNTER — Ambulatory Visit: Payer: Self-pay

## 2022-12-04 VITALS — BP 148/90 | HR 76

## 2022-12-04 DIAGNOSIS — Z79899 Other long term (current) drug therapy: Secondary | ICD-10-CM

## 2022-12-04 DIAGNOSIS — Z0130 Encounter for examination of blood pressure without abnormal findings: Secondary | ICD-10-CM

## 2022-12-04 DIAGNOSIS — E785 Hyperlipidemia, unspecified: Secondary | ICD-10-CM

## 2022-12-04 NOTE — Progress Notes (Signed)
Patient reported that he is only taking lisinopril consistently. Patient decided not to continue metoprolol because it made him drowsy.    After discussing bp with Dr Delrae Alfred, patient will start Amlodipine 5mg  daily.

## 2022-12-05 LAB — CBC WITH DIFFERENTIAL/PLATELET
Basophils Absolute: 0 10*3/uL (ref 0.0–0.2)
Basos: 1 %
EOS (ABSOLUTE): 0.1 10*3/uL (ref 0.0–0.4)
Eos: 3 %
Hematocrit: 49.4 % (ref 37.5–51.0)
Hemoglobin: 16.7 g/dL (ref 13.0–17.7)
Immature Grans (Abs): 0 10*3/uL (ref 0.0–0.1)
Immature Granulocytes: 0 %
Lymphocytes Absolute: 2.4 10*3/uL (ref 0.7–3.1)
Lymphs: 43 %
MCH: 30.5 pg (ref 26.6–33.0)
MCHC: 33.8 g/dL (ref 31.5–35.7)
MCV: 90 fL (ref 79–97)
Monocytes Absolute: 0.7 10*3/uL (ref 0.1–0.9)
Monocytes: 13 %
Neutrophils Absolute: 2.1 10*3/uL (ref 1.4–7.0)
Neutrophils: 40 %
Platelets: 319 10*3/uL (ref 150–450)
RBC: 5.47 x10E6/uL (ref 4.14–5.80)
RDW: 11.8 % (ref 11.6–15.4)
WBC: 5.4 10*3/uL (ref 3.4–10.8)

## 2022-12-05 LAB — COMPREHENSIVE METABOLIC PANEL
ALT: 22 IU/L (ref 0–44)
AST: 22 IU/L (ref 0–40)
Albumin/Globulin Ratio: 1.4 (ref 1.2–2.2)
Albumin: 4.2 g/dL (ref 4.1–5.1)
Alkaline Phosphatase: 84 IU/L (ref 44–121)
BUN/Creatinine Ratio: 11 (ref 9–20)
BUN: 12 mg/dL (ref 6–24)
Bilirubin Total: 0.6 mg/dL (ref 0.0–1.2)
CO2: 22 mmol/L (ref 20–29)
Calcium: 9.5 mg/dL (ref 8.7–10.2)
Chloride: 102 mmol/L (ref 96–106)
Creatinine, Ser: 1.05 mg/dL (ref 0.76–1.27)
Globulin, Total: 3 g/dL (ref 1.5–4.5)
Glucose: 83 mg/dL (ref 70–99)
Potassium: 4.2 mmol/L (ref 3.5–5.2)
Sodium: 139 mmol/L (ref 134–144)
Total Protein: 7.2 g/dL (ref 6.0–8.5)
eGFR: 90 mL/min/{1.73_m2} (ref >59)

## 2022-12-05 LAB — LIPID PANEL W/O CHOL/HDL RATIO
Cholesterol, Total: 235 mg/dL — ABNORMAL HIGH (ref 100–199)
HDL: 38 mg/dL — ABNORMAL LOW (ref >39)
LDL Chol Calc (NIH): 139 mg/dL — ABNORMAL HIGH (ref 0–99)
Triglycerides: 318 mg/dL — ABNORMAL HIGH (ref 0–149)
VLDL Cholesterol Cal: 58 mg/dL — ABNORMAL HIGH (ref 5–40)

## 2022-12-05 MED ORDER — SIMVASTATIN 40 MG PO TABS: 40.0000 mg | ORAL_TABLET | Freq: Every day | ORAL | 0 refills | Status: DC

## 2022-12-05 MED ORDER — AMLODIPINE BESYLATE 5 MG PO TABS: 5.0000 mg | ORAL_TABLET | Freq: Every day | ORAL | 0 refills | Status: DC

## 2023-01-15 ENCOUNTER — Other Ambulatory Visit: Payer: Self-pay

## 2023-01-15 MED ORDER — SIMVASTATIN 40 MG PO TABS: 40.0000 mg | ORAL_TABLET | Freq: Every day | ORAL | 0 refills | Status: DC

## 2023-01-15 MED ORDER — METOPROLOL SUCCINATE ER 25 MG PO TB24: 25.0000 mg | ORAL_TABLET | Freq: Every day | ORAL | 2 refills | Status: DC

## 2023-01-15 MED ORDER — AMLODIPINE BESYLATE 5 MG PO TABS: 5.0000 mg | ORAL_TABLET | Freq: Every day | ORAL | 0 refills | Status: AC

## 2023-01-15 MED ORDER — LISINOPRIL 20 MG PO TABS: 20.0000 mg | ORAL_TABLET | Freq: Every day | ORAL | 2 refills | Status: DC

## 2023-01-23 ENCOUNTER — Other Ambulatory Visit: Payer: Self-pay

## 2023-01-27 ENCOUNTER — Ambulatory Visit: Payer: Self-pay | Admitting: Internal Medicine

## 2023-04-21 ENCOUNTER — Ambulatory Visit: Payer: Self-pay | Admitting: Internal Medicine

## 2023-12-17 ENCOUNTER — Telehealth: Payer: Self-pay

## 2023-12-17 NOTE — Telephone Encounter (Signed)
 Received referral from [facility] for hx of htn, hld and obesity with documented sleep apnea. Last CPAP eval 6 years ago - will need to call [facility] at [phone] to schedule. Placed in sleep mailbox

## 2024-02-12 ENCOUNTER — Ambulatory Visit: Payer: Self-pay | Admitting: Internal Medicine

## 2024-04-11 ENCOUNTER — Ambulatory Visit: Payer: Self-pay | Admitting: Internal Medicine

## 2024-04-12 ENCOUNTER — Ambulatory Visit: Payer: Self-pay | Admitting: General Surgery

## 2024-04-18 ENCOUNTER — Encounter (HOSPITAL_COMMUNITY): Payer: Self-pay | Admitting: General Surgery

## 2024-04-18 NOTE — Progress Notes (Addendum)
 For Anesthesia: PCP - Adella Norris, MD  Cardiologist - Burnard Debby LABOR, MD last note 09/02/2021 in [facility]  Bowel Prep reminder: N/A  Chest x-ray - greater than 1 year in [facility] EKG - greater than 1 year in [facility] Stress Test - N/A ECHO - 06/30/2018 in [facility] Cardiac Cath - N/A Pacemaker/ICD device last checked:N/A Pacemaker orders received:N/A Device Rep notified:N/A  Spinal Cord Stimulator: N/A  Sleep Study - 09/06/2018 CPAP - Yes  Fasting Blood Sugar - N/A Checks Blood Sugar __N/A___ times a day Date and result of last Hgb A1c-N/A  Blood Thinner Instructions:N/A Aspirin  Instructions:N/A Last Dose:N/A  Activity level: Can go up a flight of stairs and activities of daily living without stopping and without chest pain and/or shortness of breath   Anesthesia review: OSA, HTN  Patient denies shortness of breath, fever, cough and chest pain at PAT appointment   Patient verbalized understanding of instructions that were reviewed over the telephone.

## 2024-04-27 NOTE — Patient Instructions (Addendum)
 Preop instructions for:  Samuel Austin     Date of Birth:  1980/10/21                     Date of Procedure:  Monday, Sept. 15, 2025  Procedure: REPAIR, HERNIA, INGUINAL, ADULT      Surgeon: Dr. Herlene Bureau Facility contact: [facility]    Phone:  [phone]               Health Care POA: RN contact name/phone#:  Tiara                        and Fax #: [phone]   Transportation contact phone#: [facility] [phone]  Please send day of procedure:  Current med list  Medications taken the day of procedure (return attached form to hospital) confirm time of nothing by mouth status (return attached form to hospital) Patient Demographic info( to include DNR status, problem list, allergies) Bring Insurance card and picture ID    Time to arrive at [facility]: 6:00 AM   Report to: Admitting (On your left hand side)    Do not eat solid food past midnight the night before your procedure.(To include any tube feedings-must be discontinued)  May have the following until 6:00 AM day of procedure  CLEAR LIQUID DIET  Water Black Coffee (sugar ok, NO MILK/CREAM OR CREAMERS)  Tea (sugar ok, NO MILK/CREAM OR CREAMERS) regular and decaf                             Plain Jell-O (NO RED)                                           Fruit ices (not with fruit pulp, NO RED)                                     Popsicles (NO RED)                                                                  Juice: apple, WHITE grape, WHITE cranberry Sports drinks like Gatorade (NO RED)   Take these morning medications only with sips of water.(or give through gastrostomy or feeding tube).  Docusate   Hydroxyzine    Amlodipine    Citalopram     Stop all vitamins and herbal supplements 7 days before surgery.  Oral Hygiene is also important to reduce your risk of infection.                                    Remember - BRUSH YOUR TEETH THE MORNING OF  SURGERY WITH YOUR REGULAR TOOTHPASTE   DENTURES WILL BE REMOVED PRIOR TO SURGERY PLEASE DO NOT APPLY Poly grip OR ADHESIVES!!!  Leave all jewelry and other valuables at place where living( no metal or rings to be worn) No contact lens Men-no colognes,lotions  Any questions day of procedure,call  SHORT STAY-[phone]     Sent from :[facility]                   Phone:[phone]                   [phone]   Sent by : Caidyn Henricksen              RN

## 2024-05-08 NOTE — Anesthesia Preprocedure Evaluation (Addendum)
 Anesthesia Evaluation  Patient identified by MRN, date of birth, ID band Patient awake    Reviewed: Allergy & Precautions, NPO status , Patient's Chart, lab work & pertinent test results  Airway Mallampati: III  TM Distance: >3 FB Neck ROM: Full    Dental  (+) Dental Advisory Given, Caps   Pulmonary sleep apnea and Continuous Positive Airway Pressure Ventilation , former smoker   Pulmonary exam normal breath sounds clear to auscultation       Cardiovascular hypertension, Pt. on medications  Rhythm:Regular Rate:Normal  Echo 2019 - Left ventricle: The cavity size was normal. There was mild    concentric hypertrophy. Systolic function was normal. The    estimated ejection fraction was in the range of 55% to 60%.    Images were inadequate for LV wall motion assessment. The study    is not technically sufficient to allow evaluation of LV diastolic    function.  - Mitral valve: Calcified annulus.      Neuro/Psych  PSYCHIATRIC DISORDERS Anxiety Depression Bipolar Disorder   negative neurological ROS     GI/Hepatic negative GI ROS, Neg liver ROS,,,  Endo/Other    Class 3 obesity  Renal/GU negative Renal ROS     Musculoskeletal negative musculoskeletal ROS (+)    Abdominal  (+) + obese  Peds  Hematology negative hematology ROS (+)   Anesthesia Other Findings   Reproductive/Obstetrics                              Anesthesia Physical Anesthesia Plan  ASA: 3  Anesthesia Plan: General   Post-op Pain Management: Tylenol  PO (pre-op)*, Gabapentin  PO (pre-op)* and Regional block*   Induction: Intravenous  PONV Risk Score and Plan: 4 or greater and Ondansetron , Dexamethasone , Treatment may vary due to age or medical condition and Midazolam   Airway Management Planned: Oral ETT and Video Laryngoscope Planned  Additional Equipment:   Intra-op Plan:   Post-operative Plan: Extubation in  OR  Informed Consent: I have reviewed the patients History and Physical, chart, labs and discussed the procedure including the risks, benefits and alternatives for the proposed anesthesia with the patient or authorized representative who has indicated his/her understanding and acceptance.     Dental advisory given  Plan Discussed with: CRNA  Anesthesia Plan Comments: (Risks of anesthesia explained at length. This includes, but is not limited to, sore throat, damage to teeth, lips gums, tongue and vocal cords, nausea and vomiting, reactions to medications, stroke, heart attack, and death. All patient questions were answered and the patient wishes to proceed. Risks of peripheral nerve block explained at length. This includes, but is not limited to, bleeding, infection, reactions to the medications, seizures, damage to surrounding structures, damage to nerves, permanent weakness, numbness, tingling and pain. All patient questions were answered and patient wishes to proceed with nerve block. )         Anesthesia Quick Evaluation

## 2024-05-09 ENCOUNTER — Ambulatory Visit (HOSPITAL_BASED_OUTPATIENT_CLINIC_OR_DEPARTMENT_OTHER): Payer: Self-pay | Admitting: Anesthesiology

## 2024-05-09 ENCOUNTER — Ambulatory Visit (HOSPITAL_COMMUNITY)
Admission: RE | Admit: 2024-05-09 | Discharge: 2024-05-09 | Payer: Self-pay | Attending: General Surgery | Admitting: General Surgery

## 2024-05-09 ENCOUNTER — Ambulatory Visit (HOSPITAL_COMMUNITY): Payer: Self-pay | Admitting: Anesthesiology

## 2024-05-09 ENCOUNTER — Other Ambulatory Visit: Payer: Self-pay

## 2024-05-09 ENCOUNTER — Encounter (HOSPITAL_COMMUNITY): Admission: RE | Payer: Self-pay | Source: Home / Self Care | Attending: General Surgery

## 2024-05-09 ENCOUNTER — Encounter (HOSPITAL_COMMUNITY): Payer: Self-pay | Admitting: General Surgery

## 2024-05-09 DIAGNOSIS — Z6841 Body Mass Index (BMI) 40.0 and over, adult: Secondary | ICD-10-CM | POA: Insufficient documentation

## 2024-05-09 DIAGNOSIS — E66813 Obesity, class 3: Secondary | ICD-10-CM | POA: Insufficient documentation

## 2024-05-09 DIAGNOSIS — Z87891 Personal history of nicotine dependence: Secondary | ICD-10-CM | POA: Insufficient documentation

## 2024-05-09 DIAGNOSIS — K4030 Unilateral inguinal hernia, with obstruction, without gangrene, not specified as recurrent: Secondary | ICD-10-CM

## 2024-05-09 DIAGNOSIS — I10 Essential (primary) hypertension: Secondary | ICD-10-CM | POA: Insufficient documentation

## 2024-05-09 DIAGNOSIS — G4730 Sleep apnea, unspecified: Secondary | ICD-10-CM | POA: Diagnosis not present

## 2024-05-09 DIAGNOSIS — K4090 Unilateral inguinal hernia, without obstruction or gangrene, not specified as recurrent: Secondary | ICD-10-CM | POA: Diagnosis present

## 2024-05-09 HISTORY — PX: INGUINAL HERNIA REPAIR: SHX194

## 2024-05-09 LAB — CBC
HCT: 45.9 % (ref 39.0–52.0)
Hemoglobin: 14.8 g/dL (ref 13.0–17.0)
MCH: 29.5 pg (ref 26.0–34.0)
MCHC: 32.2 g/dL (ref 30.0–36.0)
MCV: 91.6 fL (ref 80.0–100.0)
Platelets: 283 K/uL (ref 150–400)
RBC: 5.01 MIL/uL (ref 4.22–5.81)
RDW: 12.1 % (ref 11.5–15.5)
WBC: 5.9 K/uL (ref 4.0–10.5)
nRBC: 0 % (ref 0.0–0.2)

## 2024-05-09 LAB — BASIC METABOLIC PANEL WITH GFR
Anion gap: 13 (ref 5–15)
BUN: 7 mg/dL (ref 6–20)
CO2: 24 mmol/L (ref 22–32)
Calcium: 9.1 mg/dL (ref 8.9–10.3)
Chloride: 105 mmol/L (ref 98–111)
Creatinine, Ser: 1.03 mg/dL (ref 0.61–1.24)
GFR, Estimated: >60 mL/min (ref >60)
Glucose, Bld: 87 mg/dL (ref 70–99)
Potassium: 4 mmol/L (ref 3.5–5.1)
Sodium: 142 mmol/L (ref 135–145)

## 2024-05-09 SURGERY — REPAIR, HERNIA, INGUINAL, ADULT
Anesthesia: General | Laterality: Left

## 2024-05-09 MED ORDER — OXYCODONE HCL 5 MG PO TABS
5.0000 mg | ORAL_TABLET | Freq: Four times a day (QID) | ORAL | 0 refills | Status: DC | PRN
  Filled 2024-05-09: qty 15, 4d supply, fill #0

## 2024-05-09 MED ORDER — PROPOFOL 500 MG/50ML IV EMUL
INTRAVENOUS | Status: AC
  Filled 2024-05-09: qty 50

## 2024-05-09 MED ORDER — PROPOFOL 10 MG/ML IV BOLUS
INTRAVENOUS | Status: AC
Start: 2024-05-09 — End: 2024-05-09
  Filled 2024-05-09: qty 20

## 2024-05-09 MED ORDER — BUPIVACAINE HCL (PF) 0.5 % IJ SOLN
INTRAMUSCULAR | Status: AC
  Filled 2024-05-09: qty 30

## 2024-05-09 MED ORDER — MIDAZOLAM HCL 5 MG/5ML IJ SOLN
INTRAMUSCULAR | Status: DC | PRN
  Administered 2024-05-09: 1 mg via INTRAVENOUS

## 2024-05-09 MED ORDER — BUPIVACAINE-EPINEPHRINE (PF) 0.25% -1:200000 IJ SOLN
INTRAMUSCULAR | Status: AC
  Filled 2024-05-09: qty 30

## 2024-05-09 MED ORDER — ACETAMINOPHEN 500 MG PO TABS
1000.0000 mg | ORAL_TABLET | ORAL | Status: AC
  Administered 2024-05-09: 1000 mg via ORAL
  Filled 2024-05-09: qty 2

## 2024-05-09 MED ORDER — CHLORHEXIDINE GLUCONATE CLOTH 2 % EX PADS: 6.0000 | MEDICATED_PAD | Freq: Once | CUTANEOUS | Status: DC

## 2024-05-09 MED ORDER — OXYCODONE HCL 5 MG PO TABS
5.0000 mg | ORAL_TABLET | Freq: Once | ORAL | Status: AC | PRN
  Administered 2024-05-09: 5 mg via ORAL

## 2024-05-09 MED ORDER — GLYCOPYRROLATE PF 0.2 MG/ML IJ SOSY
PREFILLED_SYRINGE | INTRAMUSCULAR | Status: DC | PRN
Start: 2024-05-09 — End: 2024-05-09
  Administered 2024-05-09: .2 mg via INTRAVENOUS

## 2024-05-09 MED ORDER — MIDAZOLAM HCL 2 MG/2ML IJ SOLN
1.0000 mg | Freq: Once | INTRAMUSCULAR | Status: AC
Start: 2024-05-09 — End: 2024-05-09
  Administered 2024-05-09: 2 mg via INTRAVENOUS
  Filled 2024-05-09: qty 2

## 2024-05-09 MED ORDER — BUPIVACAINE-EPINEPHRINE (PF) 0.25% -1:200000 IJ SOLN
INTRAMUSCULAR | Status: DC | PRN
  Administered 2024-05-09: 20 mL

## 2024-05-09 MED ORDER — CHLORHEXIDINE GLUCONATE CLOTH 2 % EX PADS
6.0000 | MEDICATED_PAD | Freq: Once | CUTANEOUS | Status: DC
Start: 2024-05-09 — End: 2024-05-09

## 2024-05-09 MED ORDER — LACTATED RINGERS IV SOLN: INTRAVENOUS | Status: DC

## 2024-05-09 MED ORDER — DEXAMETHASONE SODIUM PHOSPHATE 4 MG/ML IJ SOLN
INTRAMUSCULAR | Status: DC | PRN
  Administered 2024-05-09: 5 mg via PERINEURAL

## 2024-05-09 MED ORDER — ONDANSETRON HCL 4 MG/2ML IJ SOLN
INTRAMUSCULAR | Status: AC
  Filled 2024-05-09: qty 2

## 2024-05-09 MED ORDER — ROCURONIUM BROMIDE 10 MG/ML (PF) SYRINGE
PREFILLED_SYRINGE | INTRAVENOUS | Status: DC | PRN
  Administered 2024-05-09: 20 mg via INTRAVENOUS
  Administered 2024-05-09: 70 mg via INTRAVENOUS

## 2024-05-09 MED ORDER — OXYCODONE HCL 5 MG/5ML PO SOLN: 5.0000 mg | Freq: Once | ORAL | Status: AC | PRN

## 2024-05-09 MED ORDER — CLONIDINE HCL (ANALGESIA) 100 MCG/ML EP SOLN
EPIDURAL | Status: DC | PRN
  Administered 2024-05-09: 80 ug

## 2024-05-09 MED ORDER — HYDROMORPHONE HCL 1 MG/ML IJ SOLN: 0.2500 mg | INTRAMUSCULAR | Status: DC | PRN

## 2024-05-09 MED ORDER — FENTANYL CITRATE (PF) 100 MCG/2ML IJ SOLN
INTRAMUSCULAR | Status: AC
  Filled 2024-05-09: qty 2

## 2024-05-09 MED ORDER — SUGAMMADEX SODIUM 200 MG/2ML IV SOLN
INTRAVENOUS | Status: AC
  Filled 2024-05-09: qty 2

## 2024-05-09 MED ORDER — ONDANSETRON HCL 4 MG/2ML IJ SOLN
INTRAMUSCULAR | Status: DC | PRN
  Administered 2024-05-09: 4 mg via INTRAVENOUS

## 2024-05-09 MED ORDER — CHLORHEXIDINE GLUCONATE 0.12 % MT SOLN: 15.0000 mL | Freq: Once | OROMUCOSAL | Status: DC

## 2024-05-09 MED ORDER — LIDOCAINE HCL (PF) 2 % IJ SOLN
INTRAMUSCULAR | Status: AC
  Filled 2024-05-09: qty 5

## 2024-05-09 MED ORDER — SUGAMMADEX SODIUM 200 MG/2ML IV SOLN
INTRAVENOUS | Status: DC | PRN
  Administered 2024-05-09: 200 mg via INTRAVENOUS

## 2024-05-09 MED ORDER — DEXAMETHASONE SODIUM PHOSPHATE 10 MG/ML IJ SOLN
INTRAMUSCULAR | Status: AC
Start: 2024-05-09 — End: 2024-05-09
  Filled 2024-05-09: qty 1

## 2024-05-09 MED ORDER — ROPIVACAINE HCL 5 MG/ML IJ SOLN
INTRAMUSCULAR | Status: DC | PRN
  Administered 2024-05-09: 30 mL via PERINEURAL

## 2024-05-09 MED ORDER — DEXAMETHASONE SODIUM PHOSPHATE 10 MG/ML IJ SOLN
INTRAMUSCULAR | Status: DC | PRN
  Administered 2024-05-09: 5 mg via INTRAVENOUS

## 2024-05-09 MED ORDER — GABAPENTIN 300 MG PO CAPS: 300.0000 mg | ORAL_CAPSULE | Freq: Once | ORAL | Status: DC

## 2024-05-09 MED ORDER — MIDAZOLAM HCL 2 MG/2ML IJ SOLN
INTRAMUSCULAR | Status: AC
  Filled 2024-05-09: qty 2

## 2024-05-09 MED ORDER — PROPOFOL 10 MG/ML IV BOLUS
INTRAVENOUS | Status: DC | PRN
  Administered 2024-05-09: 130 mg via INTRAVENOUS
  Administered 2024-05-09: 200 mg via INTRAVENOUS

## 2024-05-09 MED ORDER — FENTANYL CITRATE PF 50 MCG/ML IJ SOSY
50.0000 ug | PREFILLED_SYRINGE | Freq: Once | INTRAMUSCULAR | Status: AC
  Administered 2024-05-09: 50 ug via INTRAVENOUS
  Filled 2024-05-09: qty 2

## 2024-05-09 MED ORDER — ROCURONIUM BROMIDE 10 MG/ML (PF) SYRINGE
PREFILLED_SYRINGE | INTRAVENOUS | Status: AC
  Filled 2024-05-09: qty 10

## 2024-05-09 MED ORDER — DROPERIDOL 2.5 MG/ML IJ SOLN: 0.6250 mg | Freq: Once | INTRAMUSCULAR | Status: DC | PRN

## 2024-05-09 MED ORDER — CEFAZOLIN SODIUM-DEXTROSE 2-4 GM/100ML-% IV SOLN
2.0000 g | INTRAVENOUS | Status: AC
  Administered 2024-05-09: 2 g via INTRAVENOUS
  Filled 2024-05-09: qty 100

## 2024-05-09 MED ORDER — OXYCODONE HCL 5 MG PO TABS
ORAL_TABLET | ORAL | Status: AC
  Filled 2024-05-09: qty 1

## 2024-05-09 MED ORDER — FENTANYL CITRATE (PF) 100 MCG/2ML IJ SOLN
INTRAMUSCULAR | Status: DC | PRN
  Administered 2024-05-09 (×2): 50 ug via INTRAVENOUS
  Administered 2024-05-09: 100 ug via INTRAVENOUS

## 2024-05-09 MED ORDER — ORAL CARE MOUTH RINSE: 15.0000 mL | Freq: Once | OROMUCOSAL | Status: DC

## 2024-05-09 MED ORDER — GABAPENTIN 300 MG PO CAPS
300.0000 mg | ORAL_CAPSULE | ORAL | Status: AC
  Administered 2024-05-09: 300 mg via ORAL
  Filled 2024-05-09: qty 1

## 2024-05-09 MED ORDER — 0.9 % SODIUM CHLORIDE (POUR BTL) OPTIME
TOPICAL | Status: DC | PRN
  Administered 2024-05-09: 1000 mL

## 2024-05-09 MED ORDER — LIDOCAINE 2% (20 MG/ML) 5 ML SYRINGE
INTRAMUSCULAR | Status: DC | PRN
  Administered 2024-05-09: 100 mg via INTRAVENOUS

## 2024-05-09 SURGICAL SUPPLY — 37 items
BAG COUNTER SPONGE SURGICOUNT (BAG) ×1 IMPLANT
BENZOIN TINCTURE PRP APPL 2/3 (GAUZE/BANDAGES/DRESSINGS) IMPLANT
BLADE SURG 15 STRL LF DISP TIS (BLADE) ×1 IMPLANT
CHLORAPREP W/TINT 26 (MISCELLANEOUS) ×1 IMPLANT
COVER SURGICAL LIGHT HANDLE (MISCELLANEOUS) ×1 IMPLANT
DERMABOND ADVANCED .7 DNX12 (GAUZE/BANDAGES/DRESSINGS) ×1 IMPLANT
DERMABOND ADVANCED .7 DNX6 (GAUZE/BANDAGES/DRESSINGS) IMPLANT
DRAIN PENROSE 0.5X18 (DRAIN) IMPLANT
DRAPE LAPAROTOMY TRNSV 102X78 (DRAPES) ×1 IMPLANT
DRAPE UTILITY XL STRL (DRAPES) ×1 IMPLANT
DRSG TELFA PLUS 4X6 ADH ISLAND (GAUZE/BANDAGES/DRESSINGS) IMPLANT
ELECT REM PT RETURN 15FT ADLT (MISCELLANEOUS) ×1 IMPLANT
GAUZE SPONGE 4X4 12PLY STRL (GAUZE/BANDAGES/DRESSINGS) IMPLANT
GLOVE BIOGEL PI IND STRL 7.0 (GLOVE) IMPLANT
GLOVE SURG SS PI 7.0 STRL IVOR (GLOVE) ×1 IMPLANT
GOWN STRL REUS W/ TWL XL LVL3 (GOWN DISPOSABLE) ×1 IMPLANT
KIT BASIN OR (CUSTOM PROCEDURE TRAY) ×1 IMPLANT
KIT TURNOVER KIT A (KITS) ×1 IMPLANT
MARKER SKIN DUAL TIP RULER LAB (MISCELLANEOUS) ×1 IMPLANT
MESH HERNIA 3X6 (Mesh General) IMPLANT
NDL HYPO 22X1.5 SAFETY MO (MISCELLANEOUS) ×1 IMPLANT
NEEDLE HYPO 22X1.5 SAFETY MO (MISCELLANEOUS) ×1 IMPLANT
PACK BASIC VI WITH GOWN DISP (CUSTOM PROCEDURE TRAY) ×1 IMPLANT
PENCIL SMOKE EVACUATOR (MISCELLANEOUS) ×1 IMPLANT
SPIKE FLUID TRANSFER (MISCELLANEOUS) ×1 IMPLANT
SPONGE T-LAP 4X18 ~~LOC~~+RFID (SPONGE) ×1 IMPLANT
STRIP CLOSURE SKIN 1/2X4 (GAUZE/BANDAGES/DRESSINGS) IMPLANT
SUT MNCRL AB 4-0 PS2 18 (SUTURE) ×1 IMPLANT
SUT PDS AB 2-0 CT2 27 (SUTURE) ×1 IMPLANT
SUT PROLENE 2 0 CT2 30 (SUTURE) ×2 IMPLANT
SUT VIC AB 3-0 SH 18 (SUTURE) IMPLANT
SUT VIC AB 3-0 SH 27XBRD (SUTURE) IMPLANT
SUT VICRYL 3-0 CR8 SH (SUTURE) ×1 IMPLANT
SYR BULB IRRIG 60ML STRL (SYRINGE) ×1 IMPLANT
SYR CONTROL 10ML LL (SYRINGE) ×1 IMPLANT
TOWEL OR 17X26 10 PK STRL BLUE (TOWEL DISPOSABLE) ×1 IMPLANT
YANKAUER SUCT BULB TIP 10FT TU (MISCELLANEOUS) IMPLANT

## 2024-05-09 NOTE — Anesthesia Procedure Notes (Signed)
 Procedure Name: Intubation Date/Time: 05/09/2024 1:13 PM  Performed by: Franchot Delon RAMAN, CRNAPre-anesthesia Checklist: Patient identified, Emergency Drugs available, Suction available and Patient being monitored Patient Re-evaluated:Patient Re-evaluated prior to induction Oxygen Delivery Method: Circle System Utilized Preoxygenation: Pre-oxygenation with 100% oxygen Induction Type: IV induction Ventilation: Oral airway inserted - appropriate to patient size and Two handed mask ventilation required Laryngoscope Size: Glidescope and 4 Grade View: Grade I Tube type: Oral Tube size: 7.5 mm Number of attempts: 2 Airway Equipment and Method: Stylet and Oral airway Placement Confirmation: ETT inserted through vocal cords under direct vision, positive ETCO2 and breath sounds checked- equal and bilateral Secured at: 23 cm Tube secured with: Tape Dental Injury: Teeth and Oropharynx as per pre-operative assessment  Comments: Attempt by CRNA x1 with Mac 4, Gr 4 view. Intubation by CRNA with GlideScope 4, Gr 1 view.

## 2024-05-09 NOTE — Transfer of Care (Signed)
 Immediate Anesthesia Transfer of Care Note  Patient: Samuel Austin [facility]  Procedure(s) Performed: REPAIR, HERNIA, INGUINAL, ADULT (Left)  Patient Location: PACU  Anesthesia Type:General  Level of Consciousness: drowsy  Airway & Oxygen Therapy: Patient Spontanous Breathing and Patient connected to face mask oxygen  Post-op Assessment: Report given to RN and Post -op Vital signs reviewed and stable  Post vital signs: Reviewed and stable  Last Vitals:  Vitals Value Taken Time  BP 101/63 05/09/24 14:50  Temp    Pulse 74 05/09/24 14:53  Resp 11 05/09/24 14:52  SpO2 97 % 05/09/24 14:53  Vitals shown include unfiled device data.  Last Pain:  Vitals:   05/09/24 1105  TempSrc:   PainSc: 0-No pain         Complications: No notable events documented.

## 2024-05-09 NOTE — H&P (Signed)
 Chief Complaint  Patient presents with  New Consultation   Subjective   Samuel Austin is a 43 y.o. male new patient in today for: History of Present Illness Samuel Austin is a 43 year old male who presents with a painful and enlarging hernia.  The hernia has been present for an extended period and has increased in size, causing significant pain in the groin area. He experiences nausea and occasional vomiting, with the last episode occurring about a week ago. He also has sweating, chills, and significant pain associated with the hernia. Bowel movements are difficult, requiring exertion to achieve a satisfactory result. His weight has remained stable over the past few years, with a slight recent decrease while incarcerated.  Social Drivers of Health with Concerns   Tobacco Use: High Risk (02/13/2022)  Received from [facility]  Patient History  Smoking Tobacco Use: Former  Smokeless Tobacco Use: Current  Received from [employer]    No data to display      Outpatient Medications Prior to Visit  Medication Sig Dispense Refill  amLODIPine  (NORVASC ) 5 MG tablet Take 5 mg by mouth once daily  atorvastatin (LIPITOR) 20 MG tablet Take 20 mg by mouth once daily  hydrOXYzine  (ATARAX ) 25 MG tablet Take 25 mg by mouth as needed  lisinopriL  (ZESTRIL ) 20 MG tablet Take 20 mg by mouth   No facility-administered medications prior to visit.    Objective   Vitals:  03/09/24 0909  BP: 138/88  Weight: (!) 113.4 kg (250 lb)  Height: 165.1 cm (5' 5)  PainSc: 8  PainLoc: Abdomen   Body mass index is 41.6 kg/m. Physical Exam Constitutional:  Appearance: Normal appearance.  HENT:  Head: Normocephalic and atraumatic.  Pulmonary:  Effort: Pulmonary effort is normal.  Abdominal:  Comments: Left inguinal hernia down into left scrotum, no palpable right hernia   Musculoskeletal:  General: Normal range of motion.  Cervical back: Normal range of motion.    Neurological:  General: No focal deficit present.  Mental Status: He is alert and oriented to person, place, and time. Mental status is at baseline.   Psychiatric:  Mood and Affect: Mood normal.  Behavior: Behavior normal.  Thought Content: Thought content normal.     Assessment/Plan:   Assessment & Plan Inguinal hernia, left side Chronic left inguinal hernia with significant enlargement and pain, worsening due to delayed surgery. Possible small right inguinal hernia, asymptomatic. - Schedule surgical repair of left inguinal hernia. - We discussed etiology of hernias and how they can cause pain. We discussed options for inguinal hernia repair vs observation. We discussed details of the surgery of general anesthesia, surgical approach and incisions, dissecting the sack away from vas deference, testicular vessels and nerves and placement of mesh. We discussed risks of bleeding, infection, recurrence, injury to vas deference, testicular vessels, nerve injury, and chronic pain. He showed good understanding and wanted proceed with open left inguinal hernia repair as outpatient.  - Advised post-op recovery: soreness ~1 week, avoid heavy lifting 3 weeks, full activity 4-8 weeks. - Informed about common post-op issues: scrotal bruising, constipation, resolving in days.  Diagnoses and all orders for this visit:  Morbid (severe) obesity due to excess calories (CMS/HHS-HCC)  Non-recurrent unilateral inguinal hernia without obstruction or gangrene

## 2024-05-09 NOTE — Discharge Instructions (Signed)

## 2024-05-09 NOTE — Anesthesia Procedure Notes (Signed)
 Anesthesia Regional Block: TAP block   Pre-Anesthetic Checklist: , timeout performed,  Correct Patient, Correct Site, Correct Laterality,  Correct Procedure, Correct Position, site marked,  Risks and benefits discussed,  Surgical consent,  Pre-op evaluation,  At surgeon's request and post-op pain management  Laterality: Left  Prep: chloraprep       Needles:  Injection technique: Single-shot  Needle Type: Echogenic Stimulator Needle      Needle Gauge: 21     Additional Needles:   Procedures:,,,, ultrasound used (permanent image in chart),,    Narrative:  Start time: 05/09/2024 10:32 AM End time: 05/09/2024 10:52 AM Injection made incrementally with aspirations every 5 mL.  Performed by: Personally  Anesthesiologist: Darlyn Rush, MD  Additional Notes: BP cuff, SpO2 and EKG monitors applied. Sedation begun.  Anesthetic injected incrementally, slowly, and after neg aspirations under direct ultrasound guidance. Good fascial spread noted. Patient tolerated well.

## 2024-05-09 NOTE — Op Note (Signed)
 Preop diagnosis: left inguinal hernia  Postop diagnosis: left indirect incarcerated inguinal hernia  Procedure: open Left inguinal incarcerated hernia repair with mesh  Surgeon: Herlene Bureau, M.D.  Asst: none  Anesthesia: Gen.   Indications for procedure: Samuel Austin is a 43 y.o. male with symptoms of pain and enlarging Left inguinal hernia(s). After discussing risks, alternatives and benefits he decided on open repair and was brought to day surgery for repair.  Description of procedure: The patient was brought into the operative suite, placed supine. Anesthesia was administered with endotracheal tube. Patient was strapped in place. The patient was prepped and draped in the usual sterile fashion.  The anterior superior iliac spine and pubic tubercle were identified on the Left side. An incision was made 1cm above the connecting line, representative of the location of the inguinal ligament. The subcutaneous tissue was bluntly dissected, scarpa's fascia was dissected away. The external abdominal oblique fascia was identified and sharply opened down to the external inguinal ring. The conjoint tendon and inguinal ligament were identified. The cord structures and sac were dissected free of the surrounding tissue in 360 degrees. A penrose drain was used to encircle the contents. The cremasteric fibers were dissected free of the contents of the cord and hernia sac. The cord structures (vessels and vas deferens) were identified and carefully dissected away from the hernia sac. The hernia sac contained omentum and was opened and contents reduced into the peritoneal space.The hernia sac was dissected down to the internal inguinal ring. Preperitoneal fat was identified showing appropriate dissection. The ilioinguinal nerve was identified and divided between 3-0 vicryl ties. The sac was then reduced into the preperitoneal space. The hernia was indirect. A 3x6 Bard mesh was then used to close the  defect and reinforce the floor. The mesh was sutured to the lacunar ligament and inguinal ligament using a 2-0 prolene in running fashion. Next the superior edge of the mesh was sutured to the conjoined tendon using a 2-0 running Prolene. An additional 2-0 Prolene was used to suture the tail ends of the mesh together re-creating the deep ring. Cord structures are running in a neutral position through the mesh. Next the external abdominal oblique fascia was closed with a 2-0 Vicryl in interrupted fashion to re-create the external inguinal ring. Scarpa's fascia was closed with 3-0 Vicryl in running fashion. Skin was closed with a 4-0 Monocryl subcuticular stitch in running fashion. Dermabond place for dressing. Patient woke from anesthesia and brought to PACU in stable condition. All counts are correct.    Findings: left indirect incarcerated inguinal hernia  Specimen: none  Blood loss: 30 ml  Local anesthesia: 20 ml Marcaine   Complications: none  Implant: 3 x 6 Bard mesh  Herlene Bureau, M.D. General, Bariatric, & Minimally Invasive Surgery [facility], [place] 2:39 PM 05/09/2024

## 2024-05-09 NOTE — Anesthesia Postprocedure Evaluation (Signed)
 Anesthesia Post Note  Patient: Samuel Austin  Procedure(s) Performed: REPAIR, HERNIA, INGUINAL, ADULT (Left)     Patient location during evaluation: PACU Anesthesia Type: General Level of consciousness: sedated and patient cooperative Pain management: pain level controlled Vital Signs Assessment: post-procedure vital signs reviewed and stable Respiratory status: spontaneous breathing Cardiovascular status: stable Anesthetic complications: no   No notable events documented.  Last Vitals:  Vitals:   05/09/24 1530 05/09/24 1600  BP: 113/80 119/76  Pulse: 83 74  Resp: 17 15  Temp:  36.9 C  SpO2: 91% 92%    Last Pain:  Vitals:   05/09/24 1600  TempSrc:   PainSc: 3                  [name]

## 2024-05-10 ENCOUNTER — Encounter (HOSPITAL_COMMUNITY): Payer: Self-pay | Admitting: General Surgery

## 2024-05-16 ENCOUNTER — Other Ambulatory Visit: Payer: Self-pay

## 2024-05-18 ENCOUNTER — Other Ambulatory Visit: Payer: Self-pay

## 2024-06-03 ENCOUNTER — Ambulatory Visit (INDEPENDENT_AMBULATORY_CARE_PROVIDER_SITE_OTHER): Payer: Self-pay | Admitting: Pulmonary Disease

## 2024-06-03 ENCOUNTER — Encounter: Payer: Self-pay | Admitting: Pulmonary Disease

## 2024-06-03 VITALS — BP 121/78 | HR 73 | Temp 98.6°F | Ht 65.0 in | Wt 272.8 lb

## 2024-06-03 DIAGNOSIS — G4733 Obstructive sleep apnea (adult) (pediatric): Secondary | ICD-10-CM | POA: Diagnosis not present

## 2024-06-03 DIAGNOSIS — E66813 Obesity, class 3: Secondary | ICD-10-CM

## 2024-06-03 DIAGNOSIS — G4710 Hypersomnia, unspecified: Secondary | ICD-10-CM | POA: Diagnosis not present

## 2024-06-03 NOTE — Addendum Note (Signed)
 Addended by: Natesha Hassey M on: 06/03/2024 11:08 AM   Modules accepted: Orders

## 2024-06-03 NOTE — Progress Notes (Signed)
 Samuel Austin    [id number]    04/05/1981  Primary Care Physician:Patient, No Pcp Per  Referring Physician: No referring provider defined for this encounter.  Chief complaint:   Patient being seen for obstructive sleep apnea  HPI:  Diagnosed with severe obstructive sleep apnea in 2020 Followed up with Dr. Burnard of cardiology - Records reviewed showing his sleep apnea was well-controlled at the last visit in 2023 -He was on a CPAP of 13-20 with good control  Currently incarcerated since about March  Does not feel as good, not feeling as rested in the morning when he wakes up Usually goes to bed between 11 and 5:30 AM Sometimes bothered by pain and discomfort Mask does not seem to fit as well Currently uses an F20 Headgear and straps and hose are worn  Able to fall asleep in about 10 minutes 2-3 awakenings Weight is up since her last visit  History of hypertension, cardiac rhythm problems, hypercholesterolemia Had hernia surgery September 2025  Past history of smoking History of anxiety, depression, hypertension, hyperlipidemia   Outpatient Encounter Medications as of 06/03/2024  Medication Sig   acetaminophen  (TYLENOL ) 325 MG tablet Take 650 mg by mouth 2 (two) times daily as needed for moderate pain (pain score 4-6).   amLODipine  (NORVASC ) 5 MG tablet Take 1 tablet (5 mg total) by mouth daily.   atorvastatin (LIPITOR) 20 MG tablet Take 20 mg by mouth at bedtime.   citalopram  (CELEXA ) 10 MG tablet Take 1.5 tablets (15 mg total) by mouth daily.   docusate sodium (COLACE) 100 MG capsule Take 100 mg by mouth 2 (two) times daily.   hydrOXYzine  (ATARAX ) 50 MG tablet Take 50 mg by mouth at bedtime.   lanolin/mineral oil (KERI/THERA-DERM) LOTN Apply 1 Application topically at bedtime.   lisinopril  (ZESTRIL ) 30 MG tablet Take 30 mg by mouth daily.   risperiDONE  (RISPERDAL ) 1 MG tablet Take 1 tablet (1 mg total) by mouth at bedtime.   senna (SENOKOT) 8.6  MG tablet Take 2 tablets by mouth at bedtime.   [DISCONTINUED] oxyCODONE  (OXY IR/ROXICODONE ) 5 MG immediate release tablet Take 1 tablet (5 mg total) by mouth every 6 (six) hours as needed for severe pain (pain score 7-10).   No facility-administered encounter medications on file as of 06/03/2024.    Allergies as of 06/03/2024 - Review Complete 06/03/2024  Allergen Reaction Noted   Motrin [ibuprofen] Hives 03/03/2016   Lactose intolerance (gi) Diarrhea 05/22/2018    Past Medical History:  Diagnosis Date   Anal fistula    Anxiety    Bilateral inguinal hernia 07/2018   Bipolar disorder (HCC)    Depression    Heart palpitations    Hyperchloremia    Hyperlipidemia    Hypertension    Obesity    Panic disorder    PTSD (post-traumatic stress disorder)    Pulsatile tinnitus    Sleep apnea    uses CPAP nightly, severe   Tic disorder    Tourette disorder    Vertigo     Past Surgical History:  Procedure Laterality Date   INGUINAL HERNIA REPAIR Left 05/09/2024   Procedure: REPAIR, HERNIA, INGUINAL, ADULT;  Surgeon: Kinsinger, Herlene Righter, MD;  Location: [facility];  Service: General;  Laterality: Left;  OPEN LEFT INGUINAL HERNIA REPAIR WITH MESH    Family History  Problem Relation Age of Onset   Drug abuse Mother        crack   Bipolar  disorder Mother    Heart disease Mother        Carotid artery disease   Alcohol abuse Father     Social History   Socioeconomic History   Marital status: Single    Spouse name: Not on file   Number of children: Not on file   Years of education: Not on file   Highest education level: Not on file  Occupational History   Not on file  Tobacco Use   Smoking status: Former    Types: Cigarettes   Smokeless tobacco: Current  Vaping Use   Vaping status: Never Used  Substance and Sexual Activity   Alcohol use: Yes    Comment: occasional   Drug use: Not Currently    Types: Marijuana    Comment: Last use 07/2018   Sexual activity: Not on file   Other Topics Concern   Not on file  Social History Narrative   Lives with a male friend   Mother moved to [place] from [place]  in recent years.    No children.  Thought he had a daughter, but DNA showed he was not her father.   Social Drivers of Corporate investment banker Strain: Not on file  Food Insecurity: Not on file  Transportation Needs: Not on file  Physical Activity: Not on file  Stress: Not on file  Social Connections: Unknown (12/26/2021)   Received from [facility]   Social Network    Social Network: Not on file  Intimate Partner Violence: Unknown (11/29/2021)   Received from [facility]   HITS    Physically Hurt: Not on file    Insult or Talk Down To: Not on file    Threaten Physical Harm: Not on file    Scream or Curse: Not on file    Review of Systems  Respiratory:  Positive for apnea.   Psychiatric/Behavioral:  Positive for sleep disturbance.     Vitals:   06/03/24 0836  BP: 121/78  Pulse: 73  Temp: 98.6 F (37 C)  SpO2: 96%     Physical Exam Constitutional:      Appearance: He is obese.  HENT:     Head: Normocephalic.     Nose: No congestion.     Mouth/Throat:     Mouth: Mucous membranes are moist.     Comments: Mallampati 3, crowded oropharynx Eyes:     General: No scleral icterus.    Pupils: Pupils are equal, round, and reactive to light.  Cardiovascular:     Rate and Rhythm: Normal rate and regular rhythm.     Heart sounds: No murmur heard.    No friction rub.  Pulmonary:     Effort: No respiratory distress.     Breath sounds: No stridor. No wheezing or rhonchi.  Musculoskeletal:     Cervical back: No rigidity or tenderness.  Neurological:     Mental Status: He is alert.        06/03/2024    8:00 AM  Results of the Epworth flowsheet  Sitting and reading 3  Watching TV 2  Sitting, inactive in a public place (e.g. a theatre or a meeting) 2  As a passenger in a car for an hour without a break 3  Lying down to rest  in the afternoon when circumstances permit 3  Sitting and talking to someone 1  Sitting quietly after a lunch without alcohol 2  In a car, while stopped for a few minutes in traffic 0  Total score 16    Data Reviewed: Records from Dr. Deitra office reviewed His previous study revealed severe obstructive sleep apnea, August 27, 2018, AHI of 37.4, RDI of 79.4 He was on CPAP of 13 during a titration with a residual AHI of 4.1 Prescribed auto CPAP 13-20  Reviewed sleep study from 08/03/2018  Dr. Joesphine notes from 09/02/2021 reviewed  Dr. Joesphine note from 04/19/2020 reviewed   Assessment/Plan: Severe obstructive sleep apnea  Excessive daytime sleepiness  Class III obesity  Patient is currently incarcerated  Did call the medical department, they could not help with obtaining a download from the machine  Paperwork was done to request for CPAP supplies-hose, mask  Encouraged to continue using machine on a nightly basis  Encouraged to get enough hours of sleep at night-79 hours will be optimal  Encourage weight loss efforts  Replacing his mask for benefit will go a long way in controlling symptoms as previous compliance documentation shows adequately treated sleep apnea  Tentative follow-up will be scheduled in about 6 months  Encouraged to reach out to us  if we can be of assistance  I spent 45 minutes dedicated to the care of this patient on the date of this encounter to include previsit review of records, face-to-face time with the patient discussing conditions above, post visit ordering of testing,ordering medications,independentlyinterpreting results, clinical documentation with electronic health record and communicated necessary findings to members of the patient's care team  Jennet Epley MD [facility] 06/03/2024, 9:05 AM  CC: No ref. provider found

## 2024-06-03 NOTE — Addendum Note (Signed)
 Addended by: Sapna Padron M on: 06/03/2024 10:48 AM   Modules accepted: Orders

## 2024-06-03 NOTE — Patient Instructions (Addendum)
 Severe obstructive sleep apnea - Previous records did indicate optimal treatment at set pressure of 13-20 -Multiple documentation showing adequate control of events at set pressure  Needs CPAP supplies -Strap, headgear, new mask, cleaning supplies  Keep weight in check, regular exercises  The mask documented in the record that was working well was the ResMed F30 I large sized mask.-Needs a new mask, current mask leaks  Ensure 7 to 9 hours of sleep nightly  Ensure you use CPAP nightly  Follow-up in about 6 months  Contact office with any significant concerns

## 2024-06-03 NOTE — Addendum Note (Signed)
 Addended by: Roe Koffman M on: 06/03/2024 11:24 AM   Modules accepted: Orders

## 2024-06-09 ENCOUNTER — Telehealth: Payer: Self-pay

## 2024-06-09 NOTE — Telephone Encounter (Signed)
 Copied from CRM [id number]. Topic: Clinical - Order For Equipment >> Jun 06, 2024  3:03 PM Rozanna MATSU wrote: Reason for CRM: Tiara from the [facility] stated the CPAP machine is not allowing them to get results. They do not have a way to get this information. Please contact her at [phone]    Spoke w/ Tiara gave her [employer] number - let her know when orders were placed  I will reach out to rep in re guards to how the process will work for getting the Cpap supplies since patient is not able to received supplies him self.   And if she needs any more assistant to reach out to us .  -NFN
